# Patient Record
Sex: Male | Born: 1935 | Hispanic: No | Marital: Single
Health system: Southern US, Community
[De-identification: ages and names within clinical notes are randomized; demographics above are authoritative.]

## PROBLEM LIST (undated history)

## (undated) DIAGNOSIS — F0280 Dementia in other diseases classified elsewhere without behavioral disturbance: Secondary | ICD-10-CM

## (undated) DIAGNOSIS — G309 Alzheimer's disease, unspecified: Secondary | ICD-10-CM

## (undated) DIAGNOSIS — E119 Type 2 diabetes mellitus without complications: Secondary | ICD-10-CM

## (undated) DIAGNOSIS — E559 Vitamin D deficiency, unspecified: Secondary | ICD-10-CM

## (undated) DIAGNOSIS — I10 Essential (primary) hypertension: Secondary | ICD-10-CM

## (undated) DIAGNOSIS — M1990 Unspecified osteoarthritis, unspecified site: Secondary | ICD-10-CM

## (undated) DIAGNOSIS — E785 Hyperlipidemia, unspecified: Secondary | ICD-10-CM

---

## 1998-03-21 ENCOUNTER — Encounter: Payer: Self-pay | Admitting: Emergency Medicine

## 1998-03-21 ENCOUNTER — Emergency Department (HOSPITAL_COMMUNITY): Admission: EM | Admit: 1998-03-21 | Discharge: 1998-03-21 | Payer: Self-pay | Admitting: *Deleted

## 2017-06-03 ENCOUNTER — Emergency Department (HOSPITAL_COMMUNITY): Payer: Medicare (Managed Care)

## 2017-06-03 ENCOUNTER — Encounter (HOSPITAL_COMMUNITY): Payer: Self-pay

## 2017-06-03 ENCOUNTER — Inpatient Hospital Stay (HOSPITAL_COMMUNITY): Payer: Medicare (Managed Care)

## 2017-06-03 ENCOUNTER — Encounter (HOSPITAL_COMMUNITY)
Admission: EM | Disposition: E | Payer: Self-pay | Source: Home / Self Care | Attending: Student in an Organized Health Care Education/Training Program

## 2017-06-03 ENCOUNTER — Emergency Department (HOSPITAL_COMMUNITY): Payer: Medicare (Managed Care) | Admitting: Certified Registered Nurse Anesthetist

## 2017-06-03 ENCOUNTER — Inpatient Hospital Stay (HOSPITAL_COMMUNITY)
Admission: EM | Admit: 2017-06-03 | Discharge: 2017-06-10 | DRG: 023 | Disposition: E | Payer: Medicare (Managed Care) | Attending: Neurology | Admitting: Neurology

## 2017-06-03 ENCOUNTER — Telehealth (HOSPITAL_COMMUNITY): Payer: Self-pay | Admitting: *Deleted

## 2017-06-03 DIAGNOSIS — R29705 NIHSS score 5: Secondary | ICD-10-CM | POA: Diagnosis present

## 2017-06-03 DIAGNOSIS — E785 Hyperlipidemia, unspecified: Secondary | ICD-10-CM | POA: Diagnosis present

## 2017-06-03 DIAGNOSIS — Z9911 Dependence on respirator [ventilator] status: Secondary | ICD-10-CM

## 2017-06-03 DIAGNOSIS — I639 Cerebral infarction, unspecified: Secondary | ICD-10-CM | POA: Diagnosis not present

## 2017-06-03 DIAGNOSIS — R414 Neurologic neglect syndrome: Secondary | ICD-10-CM | POA: Diagnosis present

## 2017-06-03 DIAGNOSIS — I63411 Cerebral infarction due to embolism of right middle cerebral artery: Principal | ICD-10-CM | POA: Diagnosis present

## 2017-06-03 DIAGNOSIS — G9382 Brain death: Secondary | ICD-10-CM | POA: Diagnosis present

## 2017-06-03 DIAGNOSIS — I6601 Occlusion and stenosis of right middle cerebral artery: Secondary | ICD-10-CM | POA: Diagnosis present

## 2017-06-03 DIAGNOSIS — G309 Alzheimer's disease, unspecified: Secondary | ICD-10-CM | POA: Diagnosis present

## 2017-06-03 DIAGNOSIS — N179 Acute kidney failure, unspecified: Secondary | ICD-10-CM | POA: Diagnosis present

## 2017-06-03 DIAGNOSIS — G919 Hydrocephalus, unspecified: Secondary | ICD-10-CM | POA: Diagnosis present

## 2017-06-03 DIAGNOSIS — E1165 Type 2 diabetes mellitus with hyperglycemia: Secondary | ICD-10-CM | POA: Diagnosis present

## 2017-06-03 DIAGNOSIS — R4182 Altered mental status, unspecified: Secondary | ICD-10-CM | POA: Diagnosis present

## 2017-06-03 DIAGNOSIS — I609 Nontraumatic subarachnoid hemorrhage, unspecified: Secondary | ICD-10-CM | POA: Diagnosis present

## 2017-06-03 DIAGNOSIS — G8194 Hemiplegia, unspecified affecting left nondominant side: Secondary | ICD-10-CM | POA: Diagnosis present

## 2017-06-03 DIAGNOSIS — G935 Compression of brain: Secondary | ICD-10-CM | POA: Diagnosis present

## 2017-06-03 DIAGNOSIS — H53462 Homonymous bilateral field defects, left side: Secondary | ICD-10-CM | POA: Diagnosis present

## 2017-06-03 DIAGNOSIS — J9601 Acute respiratory failure with hypoxia: Secondary | ICD-10-CM | POA: Diagnosis not present

## 2017-06-03 DIAGNOSIS — F0280 Dementia in other diseases classified elsewhere without behavioral disturbance: Secondary | ICD-10-CM | POA: Diagnosis present

## 2017-06-03 DIAGNOSIS — I10 Essential (primary) hypertension: Secondary | ICD-10-CM | POA: Diagnosis present

## 2017-06-03 DIAGNOSIS — R29810 Facial weakness: Secondary | ICD-10-CM | POA: Diagnosis present

## 2017-06-03 DIAGNOSIS — I351 Nonrheumatic aortic (valve) insufficiency: Secondary | ICD-10-CM | POA: Diagnosis not present

## 2017-06-03 HISTORY — DX: Dementia in other diseases classified elsewhere, unspecified severity, without behavioral disturbance, psychotic disturbance, mood disturbance, and anxiety: F02.80

## 2017-06-03 HISTORY — DX: Alzheimer's disease, unspecified: G30.9

## 2017-06-03 HISTORY — PX: IR PERCUTANEOUS ART THROMBECTOMY/INFUSION INTRACRANIAL INC DIAG ANGIO: IMG6087

## 2017-06-03 HISTORY — DX: Essential (primary) hypertension: I10

## 2017-06-03 HISTORY — PX: IR CT HEAD LTD: IMG2386

## 2017-06-03 HISTORY — DX: Hyperlipidemia, unspecified: E78.5

## 2017-06-03 HISTORY — PX: IR ANGIO INTRA EXTRACRAN SEL COM CAROTID INNOMINATE UNI L MOD SED: IMG5358

## 2017-06-03 HISTORY — DX: Unspecified osteoarthritis, unspecified site: M19.90

## 2017-06-03 HISTORY — DX: Type 2 diabetes mellitus without complications: E11.9

## 2017-06-03 HISTORY — PX: RADIOLOGY WITH ANESTHESIA: SHX6223

## 2017-06-03 HISTORY — DX: Vitamin D deficiency, unspecified: E55.9

## 2017-06-03 LAB — MRSA PCR SCREENING: MRSA by PCR: NEGATIVE

## 2017-06-03 LAB — TYPE AND SCREEN
ABO/RH(D): A POS
ANTIBODY SCREEN: NEGATIVE
UNIT DIVISION: 0
Unit division: 0

## 2017-06-03 LAB — DIFFERENTIAL
BASOS ABS: 0 10*3/uL (ref 0.0–0.1)
Basophils Relative: 0 %
Eosinophils Absolute: 0.8 10*3/uL — ABNORMAL HIGH (ref 0.0–0.7)
Eosinophils Relative: 11 %
LYMPHS ABS: 2.1 10*3/uL (ref 0.7–4.0)
LYMPHS PCT: 29 %
MONOS PCT: 7 %
Monocytes Absolute: 0.5 10*3/uL (ref 0.1–1.0)
NEUTROS ABS: 3.9 10*3/uL (ref 1.7–7.7)
NEUTROS PCT: 53 %

## 2017-06-03 LAB — GLUCOSE, CAPILLARY: Glucose-Capillary: 261 mg/dL — ABNORMAL HIGH (ref 65–99)

## 2017-06-03 LAB — BPAM RBC
Blood Product Expiration Date: 201903072359
Blood Product Expiration Date: 201903112359
ISSUE DATE / TIME: 201902221748
ISSUE DATE / TIME: 201902221748
UNIT TYPE AND RH: 9500
Unit Type and Rh: 9500

## 2017-06-03 LAB — POCT I-STAT 3, ART BLOOD GAS (G3+)
Acid-base deficit: 9 mmol/L — ABNORMAL HIGH (ref 0.0–2.0)
Acid-base deficit: 6 mmol/L — ABNORMAL HIGH (ref 0.0–2.0)
Bicarbonate: 16 mmol/L — ABNORMAL LOW (ref 20.0–28.0)
Bicarbonate: 21.8 mmol/L (ref 20.0–28.0)
O2 Saturation: 99 %
O2 Saturation: 100 %
PCO2 ART: 30.2 mmHg — AB (ref 32.0–48.0)
TCO2: 17 mmol/L — AB (ref 22–32)
TCO2: 23 mmol/L (ref 22–32)
pCO2 arterial: 47.8 mmHg (ref 32.0–48.0)
pH, Arterial: 7.321 — ABNORMAL LOW (ref 7.350–7.450)
pH, Arterial: 7.265 — ABNORMAL LOW (ref 7.350–7.450)
pO2, Arterial: 159 mmHg — ABNORMAL HIGH (ref 83.0–108.0)
pO2, Arterial: 221 mmHg — ABNORMAL HIGH (ref 83.0–108.0)

## 2017-06-03 LAB — COMPREHENSIVE METABOLIC PANEL
ALT: 17 U/L (ref 17–63)
AST: 21 U/L (ref 15–41)
Albumin: 3.6 g/dL (ref 3.5–5.0)
Alkaline Phosphatase: 40 U/L (ref 38–126)
Anion gap: 10 (ref 5–15)
BILIRUBIN TOTAL: 1.3 mg/dL — AB (ref 0.3–1.2)
BUN: 13 mg/dL (ref 6–20)
CO2: 26 mmol/L (ref 22–32)
CREATININE: 1.63 mg/dL — AB (ref 0.61–1.24)
Calcium: 9.5 mg/dL (ref 8.9–10.3)
Chloride: 100 mmol/L — ABNORMAL LOW (ref 101–111)
GFR calc Af Amer: 44 mL/min — ABNORMAL LOW (ref >60)
GFR, EST NON AFRICAN AMERICAN: 38 mL/min — AB (ref >60)
GLUCOSE: 159 mg/dL — AB (ref 65–99)
POTASSIUM: 4.7 mmol/L (ref 3.5–5.1)
Sodium: 136 mmol/L (ref 135–145)
TOTAL PROTEIN: 7.4 g/dL (ref 6.5–8.1)

## 2017-06-03 LAB — CBC
HEMATOCRIT: 49 % (ref 39.0–52.0)
HEMOGLOBIN: 16.4 g/dL (ref 13.0–17.0)
MCH: 28.7 pg (ref 26.0–34.0)
MCHC: 33.5 g/dL (ref 30.0–36.0)
MCV: 85.8 fL (ref 78.0–100.0)
Platelets: 151 10*3/uL (ref 150–400)
RBC: 5.71 MIL/uL (ref 4.22–5.81)
RDW: 13.6 % (ref 11.5–15.5)
WBC: 7.4 10*3/uL (ref 4.0–10.5)

## 2017-06-03 LAB — ETHANOL: Alcohol, Ethyl (B): <10 mg/dL (ref <10)

## 2017-06-03 LAB — I-STAT CHEM 8, ED
BUN: 17 mg/dL (ref 6–20)
CREATININE: 1.6 mg/dL — AB (ref 0.61–1.24)
Calcium, Ion: 1.13 mmol/L — ABNORMAL LOW (ref 1.15–1.40)
Chloride: 100 mmol/L — ABNORMAL LOW (ref 101–111)
GLUCOSE: 154 mg/dL — AB (ref 65–99)
HCT: 52 % (ref 39.0–52.0)
HEMOGLOBIN: 17.7 g/dL — AB (ref 13.0–17.0)
POTASSIUM: 4.7 mmol/L (ref 3.5–5.1)
Sodium: 137 mmol/L (ref 135–145)
TCO2: 28 mmol/L (ref 22–32)

## 2017-06-03 LAB — PROTIME-INR
INR: 1.06
Prothrombin Time: 13.7 seconds (ref 11.4–15.2)

## 2017-06-03 LAB — I-STAT TROPONIN, ED: TROPONIN I, POC: 0 ng/mL (ref 0.00–0.08)

## 2017-06-03 LAB — ABO/RH: ABO/RH(D): A POS

## 2017-06-03 LAB — APTT: APTT: 37 s — AB (ref 24–36)

## 2017-06-03 SURGERY — IR WITH ANESTHESIA
Anesthesia: General

## 2017-06-03 MED ORDER — DEXAMETHASONE SODIUM PHOSPHATE 4 MG/ML IJ SOLN
INTRAMUSCULAR | Status: DC | PRN
  Administered 2017-06-03: 4 mg via INTRAVENOUS

## 2017-06-03 MED ORDER — ACETAMINOPHEN 160 MG/5ML PO SOLN: 650.0000 mg | ORAL | Status: DC | PRN

## 2017-06-03 MED ORDER — NICARDIPINE HCL IN NACL 20-0.86 MG/200ML-% IV SOLN
INTRAVENOUS | Status: DC | PRN
  Administered 2017-06-03: 5 mg/h via INTRAVENOUS

## 2017-06-03 MED ORDER — ACETAMINOPHEN 325 MG PO TABS: 650.0000 mg | ORAL_TABLET | ORAL | Status: DC | PRN

## 2017-06-03 MED ORDER — SODIUM CHLORIDE 0.9 % IV SOLN: Freq: Once | INTRAVENOUS | Status: DC

## 2017-06-03 MED ORDER — INSULIN ASPART 100 UNIT/ML ~~LOC~~ SOLN: 2.0000 [IU] | SUBCUTANEOUS | Status: DC

## 2017-06-03 MED ORDER — SODIUM CHLORIDE 0.9 % IV SOLN
INTRAVENOUS | Status: DC
  Administered 2017-06-03 – 2017-06-04 (×4): via INTRAVENOUS

## 2017-06-03 MED ORDER — NICARDIPINE HCL IN NACL 20-0.86 MG/200ML-% IV SOLN: 0.0000 mg/h | INTRAVENOUS | Status: DC

## 2017-06-03 MED ORDER — FENTANYL CITRATE (PF) 100 MCG/2ML IJ SOLN
INTRAMUSCULAR | Status: AC
  Filled 2017-06-03: qty 2

## 2017-06-03 MED ORDER — STROKE: EARLY STAGES OF RECOVERY BOOK
Freq: Once | Status: DC
  Filled 2017-06-03: qty 1

## 2017-06-03 MED ORDER — CEFAZOLIN SODIUM-DEXTROSE 2-4 GM/100ML-% IV SOLN
INTRAVENOUS | Status: AC
  Filled 2017-06-03: qty 100

## 2017-06-03 MED ORDER — FENTANYL CITRATE (PF) 100 MCG/2ML IJ SOLN: 50.0000 ug | INTRAMUSCULAR | Status: DC | PRN

## 2017-06-03 MED ORDER — LACTATED RINGERS IV SOLN: INTRAVENOUS | Status: DC | PRN

## 2017-06-03 MED ORDER — ACETAMINOPHEN 650 MG RE SUPP: 650.0000 mg | RECTAL | Status: DC | PRN

## 2017-06-03 MED ORDER — IOPAMIDOL (ISOVUE-300) INJECTION 61%
INTRAVENOUS | Status: AC
  Administered 2017-06-03: 48 mL
  Filled 2017-06-03: qty 150

## 2017-06-03 MED ORDER — NITROGLYCERIN 1 MG/10 ML FOR IR/CATH LAB
INTRA_ARTERIAL | Status: AC
  Filled 2017-06-03: qty 10

## 2017-06-03 MED ORDER — TICAGRELOR 90 MG PO TABS
ORAL_TABLET | ORAL | Status: AC
  Filled 2017-06-03: qty 2

## 2017-06-03 MED ORDER — ROCURONIUM BROMIDE 100 MG/10ML IV SOLN
INTRAVENOUS | Status: DC | PRN
  Administered 2017-06-03: 30 mg via INTRAVENOUS
  Administered 2017-06-03 (×2): 20 mg via INTRAVENOUS

## 2017-06-03 MED ORDER — ORAL CARE MOUTH RINSE
15.0000 mL | Freq: Four times a day (QID) | OROMUCOSAL | Status: DC
  Administered 2017-06-03 – 2017-06-04 (×2): 15 mL via OROMUCOSAL

## 2017-06-03 MED ORDER — SODIUM CHLORIDE 0.9 % IV BOLUS (SEPSIS)
1000.0000 mL | Freq: Once | INTRAVENOUS | Status: AC
  Administered 2017-06-03: 1000 mL via INTRAVENOUS

## 2017-06-03 MED ORDER — PROPOFOL 10 MG/ML IV BOLUS
INTRAVENOUS | Status: DC | PRN
  Administered 2017-06-03: 120 mg via INTRAVENOUS

## 2017-06-03 MED ORDER — PHENYLEPHRINE HCL 10 MG/ML IJ SOLN
INTRAMUSCULAR | Status: DC | PRN
  Administered 2017-06-03: 80 ug via INTRAVENOUS
  Administered 2017-06-03: 120 ug via INTRAVENOUS
  Administered 2017-06-03: 40 ug via INTRAVENOUS

## 2017-06-03 MED ORDER — CEFAZOLIN SODIUM-DEXTROSE 2-3 GM-%(50ML) IV SOLR
2.0000 g | Freq: Once | INTRAVENOUS | Status: AC
  Administered 2017-06-03: 2 g via INTRAVENOUS

## 2017-06-03 MED ORDER — EPHEDRINE SULFATE 50 MG/ML IJ SOLN
INTRAMUSCULAR | Status: DC | PRN
  Administered 2017-06-03: 5 mg via INTRAVENOUS

## 2017-06-03 MED ORDER — LIDOCAINE HCL (CARDIAC) 20 MG/ML IV SOLN
INTRAVENOUS | Status: DC | PRN
  Administered 2017-06-03: 60 mg via INTRAVENOUS

## 2017-06-03 MED ORDER — PHENYLEPHRINE HCL 10 MG/ML IJ SOLN
INTRAVENOUS | Status: DC | PRN
  Administered 2017-06-03: 20 ug/min via INTRAVENOUS

## 2017-06-03 MED ORDER — INSULIN ASPART 100 UNIT/ML ~~LOC~~ SOLN
0.0000 [IU] | SUBCUTANEOUS | Status: DC
  Administered 2017-06-04: 8 [IU] via SUBCUTANEOUS
  Administered 2017-06-04 (×2): 3 [IU] via SUBCUTANEOUS

## 2017-06-03 MED ORDER — SODIUM CHLORIDE 0.9 % IV SOLN
0.0000 ug/min | INTRAVENOUS | Status: DC
  Administered 2017-06-04: 10 ug/min via INTRAVENOUS
  Filled 2017-06-03: qty 1

## 2017-06-03 MED ORDER — CHLORHEXIDINE GLUCONATE 0.12% ORAL RINSE (MEDLINE KIT)
15.0000 mL | Freq: Two times a day (BID) | OROMUCOSAL | Status: DC
  Administered 2017-06-03 – 2017-06-04 (×2): 15 mL via OROMUCOSAL

## 2017-06-03 MED ORDER — IOPAMIDOL (ISOVUE-300) INJECTION 61%
INTRAVENOUS | Status: AC
  Administered 2017-06-03: 60 mL
  Filled 2017-06-03: qty 150

## 2017-06-03 MED ORDER — HEPARIN SODIUM (PORCINE) 5000 UNIT/ML IJ SOLN
5000.0000 [IU] | Freq: Three times a day (TID) | INTRAMUSCULAR | Status: DC
  Administered 2017-06-03 – 2017-06-04 (×2): 5000 [IU] via SUBCUTANEOUS
  Filled 2017-06-03 (×2): qty 1

## 2017-06-03 MED ORDER — EPTIFIBATIDE 20 MG/10ML IV SOLN
INTRAVENOUS | Status: AC
  Filled 2017-06-03: qty 10

## 2017-06-03 MED ORDER — ASPIRIN 81 MG PO CHEW
CHEWABLE_TABLET | ORAL | Status: AC
  Filled 2017-06-03: qty 1

## 2017-06-03 MED ORDER — IOPAMIDOL (ISOVUE-370) INJECTION 76%
INTRAVENOUS | Status: AC
  Administered 2017-06-03: 50 mL
  Filled 2017-06-03: qty 50

## 2017-06-03 MED ORDER — ASPIRIN EC 81 MG PO TBEC
DELAYED_RELEASE_TABLET | ORAL | Status: AC | PRN
  Administered 2017-06-03: 81 mg via ORAL

## 2017-06-03 MED ORDER — SUCCINYLCHOLINE CHLORIDE 200 MG/10ML IV SOSY
PREFILLED_SYRINGE | INTRAVENOUS | Status: DC | PRN
  Administered 2017-06-03: 120 mg via INTRAVENOUS

## 2017-06-03 MED ORDER — IOPAMIDOL (ISOVUE-370) INJECTION 76%
INTRAVENOUS | Status: AC
  Administered 2017-06-03: 40 mL
  Filled 2017-06-03: qty 50

## 2017-06-03 MED ORDER — FENTANYL CITRATE (PF) 100 MCG/2ML IJ SOLN
INTRAMUSCULAR | Status: DC | PRN
  Administered 2017-06-03 (×2): 50 ug via INTRAVENOUS

## 2017-06-03 MED ORDER — PROPOFOL 1000 MG/100ML IV EMUL: 0.0000 ug/kg/min | INTRAVENOUS | Status: DC

## 2017-06-03 MED ORDER — LABETALOL HCL 5 MG/ML IV SOLN
INTRAVENOUS | Status: DC | PRN
  Administered 2017-06-03: 10 mg via INTRAVENOUS

## 2017-06-03 MED ORDER — TICAGRELOR 60 MG PO TABS
ORAL_TABLET | ORAL | Status: AC | PRN
  Administered 2017-06-03: 180 mg

## 2017-06-03 MED ORDER — DOCUSATE SODIUM 50 MG/5ML PO LIQD
100.0000 mg | Freq: Two times a day (BID) | ORAL | Status: DC
  Administered 2017-06-03: 100 mg
  Filled 2017-06-03: qty 10

## 2017-06-03 MED ORDER — SENNOSIDES-DOCUSATE SODIUM 8.6-50 MG PO TABS: 1.0000 | ORAL_TABLET | Freq: Every evening | ORAL | Status: DC | PRN

## 2017-06-03 NOTE — ED Notes (Signed)
Pt in ir  I was told the pt would leave there and go to 4 n

## 2017-06-03 NOTE — Progress Notes (Signed)
Apnea test was performed for 8 minutes using 8L of oxygen via ETT.  No respirations were noted, patient apneic for 8 minutes.  SATS remained 98% throughout and no change in HR and BP during procedure. ABG drawn results given to MD Hammonds. Patient placed back on vent on previous settings.

## 2017-06-03 NOTE — Progress Notes (Signed)
PCCM Interval Note    Dr. Wilford CornerArora spoke with family (Niece from [place]) via phone with friends are at bedside. Understand severity of condition, after conversation code status updated to Limited Code - okay with pressors/continued ventilation, no CPR/ACLS medications.    Jovita KussmaulKatalina Eubanks, AGACNP-BC [facility]  Pgr: [phone]  PCCM Pgr: [phone]

## 2017-06-03 NOTE — Anesthesia Preprocedure Evaluation (Addendum)
Anesthesia Evaluation  Patient identified by MRN, date of birth, ID band Patient confused    Reviewed: Allergy & Precautions, Patient's Chart, lab work & pertinent test resultsPreop documentation limited or incomplete due to emergent nature of procedure.  Airway   TM Distance: >3 FB     Dental  (+) Loose, Missing, Poor Dentition   Pulmonary neg pulmonary ROS,    breath sounds clear to auscultation       Cardiovascular hypertension,  Rhythm:Regular Rate:Normal     Neuro/Psych Dementia CVA    GI/Hepatic negative GI ROS, Neg liver ROS,   Endo/Other  diabetes  Renal/GU negative Renal ROS  negative genitourinary   Musculoskeletal  (+) Arthritis ,   Abdominal   Peds  Hematology negative hematology ROS (+)   Anesthesia Other Findings   Reproductive/Obstetrics                             Anesthesia Physical Anesthesia Plan  ASA: III and emergent  Anesthesia Plan: General   Post-op Pain Management:    Induction: Intravenous and Rapid sequence  PONV Risk Score and Plan: 2 and Treatment may vary due to age or medical condition and Ondansetron  Airway Management Planned: Oral ETT  Additional Equipment: None  Intra-op Plan:   Post-operative Plan: Possible Post-op intubation/ventilation  Informed Consent:   Plan Discussed with: CRNA  Anesthesia Plan Comments:         Anesthesia Quick Evaluation

## 2017-06-03 NOTE — Code Documentation (Signed)
83[facility].  Patient was LKW yesterday, however, time is unclear. Per family that patient lives with he was experiencing abnormal behaviors last night and this morning prior to going to an adult daycare center.  Patient sent to the ED d/t left sided weakness noted by staff at center.  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  Stroke team to the bedside.  Patient to CT for CTP then to STAT MRI.  Patient monitored during imaging and VSS. NIHSS 7, see documentation for details and code stroke times.  Patient with left facial droop, left arm and leg drift, left hemianopia and neglect on exam.  Patient to IR per neuro NP. Handoff with IR RN Whitney.

## 2017-06-03 NOTE — Progress Notes (Signed)
Patient ID: [name], male   DOB: 17-Oct-1934, 83 y.o.   MRN: [id number] INR . 83 yr old M with mRS  Of 2. LSW? Sometime today noted to have Lt sided weakness.and altered behavior CT brain No ICH. 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 CTP  CBF < 30 % vol 0 ml. Tmax > 6.0 ml vol 106ml. Mismatch vol 106 ml. Mismatch ratio infinite. Case reviewed with neurology. Imaging evaluated. Option of endovascular revascularization of occluded inf division with extensive penumbra and MRI findings of punctate diffusion in the same tteritory felt to be appropriate.Emergency consent was obtained from 2  MDs familiar with the case. No family members or close relatives were reachable despite multiple attempts..  Under GA diagnostic arteriogram of RT CCA revealed a large fast flow RT ECA /transverse sinus and sigmoid sinus DAVF shunting blood extracranially. Very attenuated opacification noted of the RT MCA distribution. Uncomplicated access obtained into sup branch of MCA intracranially  with microcatheter.safe position  confirmed Control; arteriogram from  A RT ICA injection revealed transient extravasation of contrast anteriorly and laterally. Partial recanalization of the inf division was noted with  extravasation at distal end of the recanalized inf division.. Hemodynamic chages managed appropriately with anesthesia and neurologist. x1 unit of platelets transfused as patient had been given 180 mg of brilinta and 81 mg of aspirin  Po in anticipation of possible stenting of the occluded vessel. Also prox ICA flow arrest was established as soon as extravasation was noted.. Flow arrest was reversed after complete unit of platelets was transfused. Control arteriograms performed in the rt and lt CCAs demonstrated absent flow intracranially. X2 DNYA CTs were also performed which showed diffuse SAH/and contrast  In the anterior and post compartments with the later CT  suggesting infratentorial l  herniation.. D/W neurologist. Fatima SangerS Harini Dearmond MD

## 2017-06-03 NOTE — ED Notes (Signed)
[facility] called/Activate Code Stroke @ 1446-per Dr. Lamar SprinklesButler-called by Marylene LandAngela

## 2017-06-03 NOTE — H&P (Addendum)
Admission H&P    Chief Complaint: none HPI: Gurjit Mcartor is an 83 y.o. male who has dementia, DM2, HTN, HLD. He was sent to the ER via EMS by his care team at his day care center for left side weakness. Day center reports that he arrived disheveled, putting a shirt on like underwear and acting odd. The left side weakness completely resolve by the time the EMS team arrived, thus no code stroke called. He is not able to provide much information, thus it is from his care-givers and staff. Normally he can speak good [place], but today he was speaking only [place]. Interpretor is at bedside. As part of his work up in the ER he had a CT/CTA. This showed a Rt M 2-3 acute occlusion, with no early ischemic changes seen. This was emergently called to the ER at 1358. CODE STROKE was then activated. Upon emergent neuro exam, he had left side neglect, left hemianopia, slight nasolabial fold flattening, NIH 5. Later there seemed to be intermittent drift in left arm/leg. NIH 5-7.  Apparently he lives with a family that helps care for them, but they are not related to him, he has no immediate family. We had considerable delay (~2mn)  in treatment due to trying to figure out who to speak with to gain an understanding of time lst normal, his baseline function and consent. Despite his poor memory, he is normally very functional at baseline, dresses himself and catches a bus to his care center daily. The family in which he lives with says he started acting oddly last night, but no left side weakness reported.  Unknown time last "normal" thus no IV tPA given. We preceded to CTP and stat MRI to better evaluate for early/late ischemic changes. There was a LVO seen on CTA at the Rt M2-3 segment, however there was a short segment in M1 that also appeared to have clot burden, this was discussed with Dr DEstanislado Pandydirectly since with an infinite mismatch seen on perfusion ratio, emergent  IA Thrombectomy was indicated.    LSN:  unknown tPA Given: not given  Past Medical History Past Medical History:  Diagnosis Date  . Alzheimer's dementia without behavioral disturbance   . Arthritis   . Diabetes mellitus without complication ([place]   . Hyperlipidemia   . Hypertension   . Vitamin D deficiency     Past Surgical History History reviewed. No pertinent surgical history.  Family History History reviewed. No pertinent family history.  Social History  has no tobacco, alcohol, and drug history on file.  Allergies No Known Allergies  Home Medications  (Not in a hospital admission)  Hospital Medications . fentaNYL      . iopamidol        ROS:  Unable to obtain d/t pt condition   Physical Examination:  Vitals:   05/21/2017 1345 05/23/2017 1400 06/09/2017 1430 05/17/2017 1500  BP:  (!) 156/99 (!) 165/84 (!) 141/80  Pulse: 65 (!) 56 63 (!) 57  Resp: 18 16 17  (!) 21  Temp:      TempSrc:      SpO2: 99% 96% 95% 97%    General - confused, frail, elderly Heart - Regular rate and rhythm - no murmer Lungs - Clear to auscultation Abdomen - Soft - non tender Extremities - Distal pulses intact - no edema Skin - Warm and dry   Neurologic Examination:   Mental Status:  Alert, oriented to his baseline dementia. Speech without evidence of dysarthria or aphasia.  Able to follow 3 step commands without difficulty. There is considerable dense left side neglect, doesn't follow commands on left side. Cranial Nerves:  III/IV/VI-Pupils were equal and reacted.Left hemianopia V/VII-no facial numbness; mild left facial weakness. Flattened nasolabial fold VIII-hearing normal.  X-normal speech and symmetrical palatal movement.  XII-midline tongue extension  Motor: Right : Upper extremity   5/5    Left:     Upper extremity   5/5  Lower extremity   5/5     Lower extremity   5/5 Tone and bulk:normal tone throughout; no atrophy noted Sensory: Intact to light touch in all extremities. extinct to DBS Plantars: Downgoing  bilaterally  Cerebellar: Normal FNF and heel to shin bilaterally. Gait: not tested  NIH: see HPI for specific scoring   LABORATORY STUDIES   Basic Metabolic Panel: Recent Labs  Lab 05/25/2017 1218 05/22/2017 1234  NA 136 137  K 4.7 4.7  CL 100* 100*  CO2 26  --   GLUCOSE 159* 154*  BUN 13 17  CREATININE 1.63* 1.60*  CALCIUM 9.5  --     Liver Function Tests: Recent Labs  Lab 05/27/2017 1218  AST 21  ALT 17  ALKPHOS 40  BILITOT 1.3*  PROT 7.4  ALBUMIN 3.6   No results for input(s): LIPASE, AMYLASE in the last 168 hours. No results for input(s): AMMONIA in the last 168 hours.  CBC: Recent Labs  Lab 05/15/2017 1218 05/27/2017 1234  WBC 7.4  --   NEUTROABS 3.9  --   HGB 16.4 17.7*  HCT 49.0 52.0  MCV 85.8  --   PLT 151  --     Cardiac Enzymes: No results for input(s): CKTOTAL, CKMB, CKMBINDEX, TROPONINI in the last 168 hours.  BNP: Invalid input(s): POCBNP  CBG: No results for input(s): GLUCAP in the last 168 hours.  Microbiology:   Coagulation Studies: Recent Labs    06/09/2017 1218  LABPROT 13.7  INR 1.06    Urinalysis: No results for input(s): COLORURINE, LABSPEC, PHURINE, GLUCOSEU, HGBUR, BILIRUBINUR, KETONESUR, PROTEINUR, UROBILINOGEN, NITRITE, LEUKOCYTESUR in the last 168 hours.  Invalid input(s): APPERANCEUR  Lipid Panel:  No results found for: CHOL, TRIG, HDL, CHOLHDL, VLDL, LDLCALC  HgbA1C:  No results found for: HGBA1C  Urine Drug Screen:  No results found for: LABOPIA, COCAINSCRNUR, LABBENZ, AMPHETMU, THCU, LABBARB   Alcohol Level:  Recent Labs  Lab 05/14/2017 1217  ETH <10    Miscellaneous Labs:  EKG:  EKG      IMAGING Ct Angio Head W Or Wo Contrast  Result Date: 06/02/2017 CLINICAL DATA:  Left-sided weakness while at physical therapy today. EXAM: CT HEAD WITHOUT CONTRAST CT ANGIOGRAPHY OF THE HEAD AND NECK TECHNIQUE: Contiguous axial images were obtained from the base of the skull through the vertex without intravenous  contrast. Multidetector CT imaging of the head and neck was performed using the standard protocol during bolus administration of intravenous contrast. Multiplanar CT image reconstructions and MIPs were obtained to evaluate the vascular anatomy. Carotid stenosis measurements (when applicable) are obtained utilizing NASCET criteria, using the distal internal carotid diameter as the denominator. CONTRAST:  56m ISOVUE-370 IOPAMIDOL (ISOVUE-370) INJECTION 76% COMPARISON:  None. FINDINGS: CT HEAD Brain: Generalized atrophy. Chronic small-vessel ischemic changes of the cerebral hemispheric white matter. No sign of acute infarction, mass lesion, hemorrhage, hydrocephalus or extra-axial collection. Vascular: There is atherosclerotic calcification of the major vessels at the base of the brain. Skull: Normal Sinuses/Orbits: Negative CTA NECK Aortic arch: Aortic atherosclerosis.  No aneurysm or  dissection. Right carotid system: Common carotid artery widely patent to the bifurcation. Carotid bifurcation is normal without soft or calcified plaque. Cervical internal carotid artery is tortuous but widely patent. Left carotid system: Common carotid artery widely patent to the bifurcation. Carotid bifurcation is normal without soft or calcified plaque. Cervical ICA is tortuous but otherwise normal. Vertebral arteries:Right vertebral artery is dominant. The origin is widely patent. The vessel is widely patent through the cervical region. Left vertebral artery is occluded at its origin but shows some reconstitution by cervical collaterals. Skeleton: Ordinary mid cervical spondylosis. Other neck: No mass or lymphadenopathy. Upper lobes are clear. There is either stenosis or partial thrombosis the left subclavian/innominate vein. CTA HEAD Anterior circulation: Both internal carotid arteries are patent through the skull base and siphon regions. There is ordinary peripheral atherosclerotic calcification in the carotid siphon regions. No  stenosis greater than 30% is suspected. The anterior and middle cerebral vessels are approximately patent without proximal stenosis, aneurysm or vascular malformation. There are missing/hypoperfusion in M2 and M3 regions on the right suggesting embolic disease in the right MCA territory. Posterior circulation: Right vertebral artery is dominant, supplying the basilar. Left vertebral artery is a small vessel that terminates in PICA. Basilar artery shows multiple serial stenoses. Flow is present within the superior cerebellar and left posterior cerebral artery from the posterior circulation. Right PCA receives it supply from the anterior circulation. Venous sinuses: Patent and normal. Anatomic variants: None significant. Delayed phase: No abnormal enhancement. IMPRESSION: Head CT does not show any acute ischemic change at this moment. However, there appears to be embolic disease to the right MCA territory in the M2 to M3 vessels. Consider CT perfusion study. Left vertebral artery occluded proximally with reconstitution of a small vessel in the upper cervical region. Serial basilar artery stenoses. These results were called by telephone at the time of interpretation on 05/26/2017 at 2:27 pm to Dr. Aletta Edouard , who verbally acknowledged these results. Electronically Signed   By: Nelson Chimes M.D.   On: 05/20/2017 14:30   Ct Head Wo Contrast  Result Date: 05/21/2017 CLINICAL DATA:  Left-sided weakness while at physical therapy today. EXAM: CT HEAD WITHOUT CONTRAST CT ANGIOGRAPHY OF THE HEAD AND NECK TECHNIQUE: Contiguous axial images were obtained from the base of the skull through the vertex without intravenous contrast. Multidetector CT imaging of the head and neck was performed using the standard protocol during bolus administration of intravenous contrast. Multiplanar CT image reconstructions and MIPs were obtained to evaluate the vascular anatomy. Carotid stenosis measurements (when applicable) are obtained  utilizing NASCET criteria, using the distal internal carotid diameter as the denominator. CONTRAST:  29m ISOVUE-370 IOPAMIDOL (ISOVUE-370) INJECTION 76% COMPARISON:  None. FINDINGS: CT HEAD Brain: Generalized atrophy. Chronic small-vessel ischemic changes of the cerebral hemispheric white matter. No sign of acute infarction, mass lesion, hemorrhage, hydrocephalus or extra-axial collection. Vascular: There is atherosclerotic calcification of the major vessels at the base of the brain. Skull: Normal Sinuses/Orbits: Negative CTA NECK Aortic arch: Aortic atherosclerosis.  No aneurysm or dissection. Right carotid system: Common carotid artery widely patent to the bifurcation. Carotid bifurcation is normal without soft or calcified plaque. Cervical internal carotid artery is tortuous but widely patent. Left carotid system: Common carotid artery widely patent to the bifurcation. Carotid bifurcation is normal without soft or calcified plaque. Cervical ICA is tortuous but otherwise normal. Vertebral arteries:Right vertebral artery is dominant. The origin is widely patent. The vessel is widely patent through the cervical region. Left vertebral  artery is occluded at its origin but shows some reconstitution by cervical collaterals. Skeleton: Ordinary mid cervical spondylosis. Other neck: No mass or lymphadenopathy. Upper lobes are clear. There is either stenosis or partial thrombosis the left subclavian/innominate vein. CTA HEAD Anterior circulation: Both internal carotid arteries are patent through the skull base and siphon regions. There is ordinary peripheral atherosclerotic calcification in the carotid siphon regions. No stenosis greater than 30% is suspected. The anterior and middle cerebral vessels are approximately patent without proximal stenosis, aneurysm or vascular malformation. There are missing/hypoperfusion in M2 and M3 regions on the right suggesting embolic disease in the right MCA territory. Posterior  circulation: Right vertebral artery is dominant, supplying the basilar. Left vertebral artery is a small vessel that terminates in PICA. Basilar artery shows multiple serial stenoses. Flow is present within the superior cerebellar and left posterior cerebral artery from the posterior circulation. Right PCA receives it supply from the anterior circulation. Venous sinuses: Patent and normal. Anatomic variants: None significant. Delayed phase: No abnormal enhancement. IMPRESSION: Head CT does not show any acute ischemic change at this moment. However, there appears to be embolic disease to the right MCA territory in the M2 to M3 vessels. Consider CT perfusion study. Left vertebral artery occluded proximally with reconstitution of a small vessel in the upper cervical region. Serial basilar artery stenoses. These results were called by telephone at the time of interpretation on 06/02/2017 at 2:27 pm to Dr. Aletta Edouard , who verbally acknowledged these results. Electronically Signed   By: Nelson Chimes M.D.   On: 05/26/2017 14:30   Ct Angio Neck W Or Wo Contrast  Result Date: 05/17/2017 CLINICAL DATA:  Left-sided weakness while at physical therapy today. EXAM: CT HEAD WITHOUT CONTRAST CT ANGIOGRAPHY OF THE HEAD AND NECK TECHNIQUE: Contiguous axial images were obtained from the base of the skull through the vertex without intravenous contrast. Multidetector CT imaging of the head and neck was performed using the standard protocol during bolus administration of intravenous contrast. Multiplanar CT image reconstructions and MIPs were obtained to evaluate the vascular anatomy. Carotid stenosis measurements (when applicable) are obtained utilizing NASCET criteria, using the distal internal carotid diameter as the denominator. CONTRAST:  7m ISOVUE-370 IOPAMIDOL (ISOVUE-370) INJECTION 76% COMPARISON:  None. FINDINGS: CT HEAD Brain: Generalized atrophy. Chronic small-vessel ischemic changes of the cerebral hemispheric  white matter. No sign of acute infarction, mass lesion, hemorrhage, hydrocephalus or extra-axial collection. Vascular: There is atherosclerotic calcification of the major vessels at the base of the brain. Skull: Normal Sinuses/Orbits: Negative CTA NECK Aortic arch: Aortic atherosclerosis.  No aneurysm or dissection. Right carotid system: Common carotid artery widely patent to the bifurcation. Carotid bifurcation is normal without soft or calcified plaque. Cervical internal carotid artery is tortuous but widely patent. Left carotid system: Common carotid artery widely patent to the bifurcation. Carotid bifurcation is normal without soft or calcified plaque. Cervical ICA is tortuous but otherwise normal. Vertebral arteries:Right vertebral artery is dominant. The origin is widely patent. The vessel is widely patent through the cervical region. Left vertebral artery is occluded at its origin but shows some reconstitution by cervical collaterals. Skeleton: Ordinary mid cervical spondylosis. Other neck: No mass or lymphadenopathy. Upper lobes are clear. There is either stenosis or partial thrombosis the left subclavian/innominate vein. CTA HEAD Anterior circulation: Both internal carotid arteries are patent through the skull base and siphon regions. There is ordinary peripheral atherosclerotic calcification in the carotid siphon regions. No stenosis greater than 30% is suspected. The anterior  and middle cerebral vessels are approximately patent without proximal stenosis, aneurysm or vascular malformation. There are missing/hypoperfusion in M2 and M3 regions on the right suggesting embolic disease in the right MCA territory. Posterior circulation: Right vertebral artery is dominant, supplying the basilar. Left vertebral artery is a small vessel that terminates in PICA. Basilar artery shows multiple serial stenoses. Flow is present within the superior cerebellar and left posterior cerebral artery from the posterior  circulation. Right PCA receives it supply from the anterior circulation. Venous sinuses: Patent and normal. Anatomic variants: None significant. Delayed phase: No abnormal enhancement. IMPRESSION: Head CT does not show any acute ischemic change at this moment. However, there appears to be embolic disease to the right MCA territory in the M2 to M3 vessels. Consider CT perfusion study. Left vertebral artery occluded proximally with reconstitution of a small vessel in the upper cervical region. Serial basilar artery stenoses. These results were called by telephone at the time of interpretation on 06/02/2017 at 2:27 pm to Dr. Aletta Edouard , who verbally acknowledged these results. Electronically Signed   By: Nelson Chimes M.D.   On: 06/01/2017 14:30   Mr Brain Wo Contrast  Result Date: 05/14/2017 CLINICAL DATA:  Code stroke follow-up. The patient was sent directly to interventional radiology following examination. EXAM: MRI HEAD WITHOUT CONTRAST TECHNIQUE: Multiplanar, multiecho pulse sequences of the brain and surrounding structures were obtained without intravenous contrast. COMPARISON:  Head CT and CTA/perfusion 06/05/2017 FINDINGS: Axial diffusion weighted imaging and axial T2-weighted FLAIR imaging were performed. There is multifocal abnormal diffusion restriction within the posterior right MCA territory, much less than the volume of penumbra identified on the perfusion study. Multifocal periventricular white matter hyperintensity, most often a result of chronic microvascular ischemia. No acute hemorrhage or mass effect. IMPRESSION: Multiple areas of ischemic infarct within the posterior right MCA territory, but much smaller than the volume of penumbra identified on the perfusion study. Electronically Signed   By: Ulyses Jarred M.D.   On: 05/23/2017 16:08   Ct Cerebral Perfusion W Contrast  Result Date: 06/07/2017 CLINICAL DATA:  83 year old male with right MCA Z6/X0 thromboembolic disease suspected on CTA  head and neck today performed for stroke-like symptoms. CT perfusion requested to evaluate treatment options. EXAM: CT PERFUSION BRAIN TECHNIQUE: Multiphase CT imaging of the brain was performed following IV bolus contrast injection. Subsequent parametric perfusion maps were calculated using RAPID software. CONTRAST:  53m ISOVUE-370 IOPAMIDOL (ISOVUE-370) INJECTION 76% COMPARISON:  CTA head and neck 1358 hr today. FINDINGS: CT Brain Perfusion Findings: CBF (<30%) Volume: None. Perfusion (Tmax>6.0s) volume: 106  mL Mismatch Volume: 106 mL Mismatch ratio: Infant. Infarction Location:No core infarct, but right MCA territory penumbra. Other findings: CT perfusion source images demonstrate the same pattern of abnormal right MCA branches as on the earlier CTA. There seems to be a short right M1 segment, early bifurcation. It is unclear whether or not there is focal thrombus at the right MCA bifurcation. IMPRESSION: 1. No core infarct detected by CT perfusion, which seems valid in light of normal ASPECTS thus far. 2. Right MCA territory penumbra estimated at 106 mL. 3. This study was reviewed in person with Dr. AAmie Portlandon 05/29/2017 at 1540 hrs. Electronically Signed   By: HGenevie AnnM.D.   On: 06/02/2017 15:56    Assessment: 83y.o. male with waxing/waning stroke symptoms. Unknown time last "normal" thus no IV tPA given. There was a LVO in M1, M2-3 seen on CTA with an infinite mismatch seen on perfusio  ratio, emergent  IA Thrombectomy was indicated.   Stroke Risk Factors - age, HTN, HLD, DM2  # Acute Ischemic Stroke, R MCA- further work up for etiology. This could be cardioembolic vs large vessel thrmboemboi.  # HTN- permissive for now, goals pending TICI flow achieved at end of procedure.  # HLD- lipids ordered. Goal <100 # DM2, with hyperglycimia- hold home meds, SSI  # Dementia- despite memory loss, typically he is able to preform all ADLs and only needs min asst with ambulation. mRS 1-2.  Plan:  Admit  to NTICU post procedure, will be greater than 2MNs  HgbA1c, fasting lipid panel  F/u imaging in 24h post procedure  PT consult, OT consult, Speech consult  Echocardiogram  Carotid dopplers  Risk factor modification  Telemetry monitoring  Frequent neuro checks per post thrombectomy protocol  Attending Neurologist's Note to Follow  Attending Neurohospitalist Addendum Patient seen and examined with APP/Resident. Agree with the history and physical as documented above.  Briefly, right MCA stroke due to occlusion or atherosclerosis of the Right MCA, posterior division.  Difficult exam with language barrier, but following simple commands, with mild left hemiparesis and complete visual and sensory neglect of the left side.  NIH stroke scale at least 6 with left-sided neglect, left visual field cut and mild left hemiparesis.  Perfusion showed favorable profile with >100cc area at risk with no core. MRI was done stat that showed scatterd embolic looking strokes in the same area as penumbra, smaller than the penumbra indicating possible salvageable brain tissue.  We attempted for over an hour to reach family members-both the numbers below.  The are not family members and did not agree to be that decision makers.  Given the emergency of the situation, neurologist and interventional radiologist made a 2 physician decision to pursue with emergent diagnostic cerebral angiogram and possible DVT in the patient's best interest.  Patient taken for diagnostic cerebral angio and possible EVT. He was given loading dose of Brilinta in anticipation for a possible stent. 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 Subsequent run showed contrast leakage -indicating possible bleeding away from the site of the guidewire or microcatheter.  Dyna CT on the table showed hemorrhage - anterolateral to the vessel of interest as well as scattered SAH, some of which will be contrast and possibly resolve  with time and not be visible on repeat imaging but some of it is real SAH. Also occlusion of the right transverse sinus noted. Unclear etiology of the bleed.  It is possible that there was a right middle cerebral artery posterior division occlusion which was actually a dissection and with recanalization or partial recanalization dissected into the subarachnoid space and caused a massive subarachnoid hemorrhage.  Dr. Estanislado Pandy ordered 2units of platelets.  SBP initially at some point bumped up to >200 and then with labetalol and cardene, came down rapidly in the 60's. Pressors ordered by anesthesia.  Delay in platelets due to shortage.  1 unit of platelets was sent.  Rt carotid balloon deflation planned for after the platelet infusion due to risk of reperfusion related bleed per Dr. Estanislado Pandy.  In the interim, also noted on the IR table that the patient's left pupil became fixed and dilated. Repeat Dyna CT was done which showed more extensive subarachnoid bleed. 1 unit of platelets was infused. Last run was made prior to retracting the microcatheter and guidewire.  There was no flow noted to the right side of the brain or  the left side of the brain.  Procedure was aborted.  Patient was moved to the neuro ICU.   Family was called-his friends where he was living-phone number [phone], who directed [place] to call in Mr. Ceasar Mons -[phone].  Dr. Estanislado Pandy and I personally spoke with both of these people and explained that it was imperative that the get in touch with a blood relative or a family member or next of kin.  1 of these people is going to come to the hospital in the next hour or so, and we will try to get more information by family.  We have not been able to contact any family members and have only been able to contact the above 2 phone numbers and people have answered those, who are family friends and not next of kin her blood relatives.  Because the patient still under the effect of the  anesthetics, a declaration of brain death at this time cannot be done.  He will be moved to the neuro ICU. We will follow him in the neuro ICU. We will obtain consultation for pulmonary critical care. At this time I do not think that his current clinical picture is compatible with survival.  I would definitely agree with obtaining a limited DNR (okay to use pressors, already intubated)- with 2 physician consent due to the absence of any next of kin and futility of the treatment based on the angiographic findings.  Formal angiographic report pending at this time.  Case discussed personally with Dr. Estanislado Pandy.  Oncoming on-call hospitalist made aware of the current situation.  Neurology team will continue to follow with you.  -- Amie Portland, MD [facility] Neurohospitalist Pager: [phone] If 7pm to 7am, please call on call as listed on AMION.  Delays/issues in the process: --history unclear re: LKN time. --Family member  --Code stroke activation after obtaining CTA --Major delays in trying to locate next of kin for consent. Major language barrier added to this.   CRITICAL CARE ATTESTATION This patient is critically ill and at significant risk of neurological worsening, death and care requires constant monitoring of vital signs, hemodynamics,respiratory and cardiac monitoring. I spent 120  minutes of neurocritical care time performing neurological assessment, discussion with family, other specialists and medical decision making of high complexityin the care of  this patient.

## 2017-06-03 NOTE — Anesthesia Postprocedure Evaluation (Signed)
Anesthesia Post Note  Patient: Adam Hull  Procedure(s) Performed: IR WITH ANESTHESIA (N/A )     Patient location during evaluation: ICU (1O10(4N22) Anesthesia Type: General Post-procedure mental status: unresponseive. Pain control: no s/s of distress. Vital Signs Assessment: post-procedure vital signs reviewed and stable Respiratory status: patient on ventilator - see flowsheet for VS Cardiovascular status: stable Postop Assessment: no apparent nausea or vomiting Anesthetic complications: no    Last Vitals:  Vitals:   05/28/2017 2100 06/07/2017 2115  BP: 100/65 109/66  Pulse: 72 72  Resp: 16 16  Temp: 36.7 C   SpO2: 99% 99%    Last Pain:  Vitals:   05/17/2017 2100  TempSrc: Axillary  PainSc:                  Catheryn Baconyan P Brigitta Pricer

## 2017-06-03 NOTE — ED Provider Notes (Signed)
[facility] EMERGENCY DEPARTMENT Provider Note   CSN: [id number] Arrival date & time: 06/21/17  1125     History   Chief Complaint No chief complaint on file.   HPI Adam Hull is a 83 y.o. male.  HPI  Level 5 caveat secondary to dementia and language barrier.  Patient is primarily [place] speaking with an unusual dialect, and history is via friends and nursing.  Patient apparently was altered since last evening 6 PM where he was noted by friends at the room he was living in to be walking around naked in the house.  This morning he put his clothing on all wrong.  [facility] and there at the facility was noted to be slow in answering and at 1015 when they evaluated him he seemed to have decreased coordination on the left side of his body and some left-sided neglect.  By the time EMS got there he was nonfocal.  He was not a stroke activation on arrival here.  Here he is awake and is able to say yes no but very limited in history.  We are attempting to get an interpreter and family.   Past Medical History:  Diagnosis Date  . Alzheimer's dementia without behavioral disturbance   . Arthritis   . Diabetes mellitus without complication (HCC)   . Hyperlipidemia   . Hypertension   . Vitamin D deficiency     There are no active problems to display for this patient.   History reviewed. No pertinent surgical history.     Home Medications    Prior to Admission medications   Not on File    Family History History reviewed. No pertinent family history.  Social History Social History   Tobacco Use  . Smoking status: Unknown If Ever Smoked  Substance Use Topics  . Alcohol use: Not on file  . Drug use: Not on file     Allergies   Patient has no known allergies.   Review of Systems Review of Systems  Unable to perform ROS: Acuity of condition     Physical Exam Updated Vital Signs BP (!) 160/91   Pulse 71   Temp  98.6 F (37 C) (Oral)   Resp (!) 28   SpO2 94%   Physical Exam  Constitutional: He appears well-developed and well-nourished.  HENT:  Head: Normocephalic and atraumatic.  Eyes: Conjunctivae are normal.  Neck: Neck supple.  Cardiovascular: Normal rate and regular rhythm.  No murmur heard. Pulmonary/Chest: Effort normal and breath sounds normal. No respiratory distress.  Abdominal: Soft. There is no tenderness.  Musculoskeletal: He exhibits no edema.  Neurological: He is alert. He has normal strength. No cranial nerve deficit or sensory deficit. GCS eye subscore is 4. GCS verbal subscore is 5. GCS motor subscore is 6.  With language barrier patient's neuro exam was difficult.  Interpreter here now who states the patient is alert and making sense.  Still having difficulty doing any kind of cerebellar testing and he gives way and does not follow full testing on the motor strength but definitely appears to be intact and strength and showing no signs of left-sided neglect.  Skin: Skin is warm and dry.  Psychiatric: He has a normal mood and affect.  Nursing note and vitals reviewed.    ED Treatments / Results  Labs (all labs ordered are listed, but only abnormal results are displayed) Labs Reviewed  ETHANOL  PROTIME-INR  APTT  CBC  DIFFERENTIAL  COMPREHENSIVE METABOLIC PANEL  RAPID URINE DRUG SCREEN, HOSP PERFORMED  URINALYSIS, ROUTINE W REFLEX MICROSCOPIC  I-STAT CHEM 8, ED  I-STAT TROPONIN, ED    EKG  EKG Interpretation  Date/Time:  Friday 05/15/2017 11:30:00 EST Ventricular Rate:  73 PR Interval:    QRS Duration: 82 QT Interval:  399 QTC Calculation: 440 R Axis:   -23 Text Interpretation:  Sinus rhythm Probable left atrial enlargement Probable left ventricular hypertrophy Confirmed by Donnetta Hutching ([id number]) on 04-Jun-2017 11:45:56 AM Also confirmed by Donnetta Hutching ([id number])  on 04-Jun-2017 11:46:16 AM       Radiology Ct Angio Head W Or Wo Contrast  Result Date:  04-Jun-2017 CLINICAL DATA:  Left-sided weakness while at physical therapy today. EXAM: CT HEAD WITHOUT CONTRAST CT ANGIOGRAPHY OF THE HEAD AND NECK TECHNIQUE: Contiguous axial images were obtained from the base of the skull through the vertex without intravenous contrast. Multidetector CT imaging of the head and neck was performed using the standard protocol during bolus administration of intravenous contrast. Multiplanar CT image reconstructions and MIPs were obtained to evaluate the vascular anatomy. Carotid stenosis measurements (when applicable) are obtained utilizing NASCET criteria, using the distal internal carotid diameter as the denominator. CONTRAST:  50mL ISOVUE-370 IOPAMIDOL (ISOVUE-370) INJECTION 76% COMPARISON:  None. FINDINGS: CT HEAD Brain: Generalized atrophy. Chronic small-vessel ischemic changes of the cerebral hemispheric white matter. No sign of acute infarction, mass lesion, hemorrhage, hydrocephalus or extra-axial collection. Vascular: There is atherosclerotic calcification of the major vessels at the base of the brain. Skull: Normal Sinuses/Orbits: Negative CTA NECK Aortic arch: Aortic atherosclerosis.  No aneurysm or dissection. Right carotid system: Common carotid artery widely patent to the bifurcation. Carotid bifurcation is normal without soft or calcified plaque. Cervical internal carotid artery is tortuous but widely patent. Left carotid system: Common carotid artery widely patent to the bifurcation. Carotid bifurcation is normal without soft or calcified plaque. Cervical ICA is tortuous but otherwise normal. Vertebral arteries:Right vertebral artery is dominant. The origin is widely patent. The vessel is widely patent through the cervical region. Left vertebral artery is occluded at its origin but shows some reconstitution by cervical collaterals. Skeleton: Ordinary mid cervical spondylosis. Other neck: No mass or lymphadenopathy. Upper lobes are clear. There is either stenosis or  partial thrombosis the left subclavian/innominate vein. CTA HEAD Anterior circulation: Both internal carotid arteries are patent through the skull base and siphon regions. There is ordinary peripheral atherosclerotic calcification in the carotid siphon regions. No stenosis greater than 30% is suspected. The anterior and middle cerebral vessels are approximately patent without proximal stenosis, aneurysm or vascular malformation. There are missing/hypoperfusion in M2 and M3 regions on the right suggesting embolic disease in the right MCA territory. Posterior circulation: Right vertebral artery is dominant, supplying the basilar. Left vertebral artery is a small vessel that terminates in PICA. Basilar artery shows multiple serial stenoses. Flow is present within the superior cerebellar and left posterior cerebral artery from the posterior circulation. Right PCA receives it supply from the anterior circulation. Venous sinuses: Patent and normal. Anatomic variants: None significant. Delayed phase: No abnormal enhancement. IMPRESSION: Head CT does not show any acute ischemic change at this moment. However, there appears to be embolic disease to the right MCA territory in the M2 to M3 vessels. Consider CT perfusion study. Left vertebral artery occluded proximally with reconstitution of a small vessel in the upper cervical region. Serial basilar artery stenoses. These results were called by telephone at the time of interpretation  on 2017-06-29 at 2:27 pm to Dr. Meridee Score , who verbally acknowledged these results. Electronically Signed   By: Paulina Fusi M.D.   On: June 29, 2017 14:30   Ct Head Wo Contrast  Result Date: 06/29/17 CLINICAL DATA:  83 y/o  M; post IR exam. EXAM: CT HEAD WITHOUT CONTRAST TECHNIQUE: Contiguous axial images were obtained from the base of the skull through the vertex without intravenous contrast. COMPARISON:  June 29, 2017 MRI head. 2017/06/29 CT head and CTA head. 2017-06-29 cerebral  angiogram. FINDINGS: Brain: Decreased high attenuation material throughout subarachnoid and intraventricular spaces concentrated basilar and posterior fossa cisterns. Extra-axial material in right MCA station measures 73 HU, likely hemorrhage. Elsewhere extra-axial material measures greater than 100 HU, likely contrast and possibly underlying hemorrhage. Interval dilatation of the lateral and third ventricles compatible developing hydrocephalus, third ventricle measures 11 mm medial-lateral, previously 9 mm. Small foci of hypoattenuation in right temporal lobe corresponding to infarcts on MR. No large brain parenchymal hemorrhage identified. Vascular: Obscured by extra-axial material. Skull: Normal. Negative for fracture or focal lesion. Sinuses/Orbits: Mild diffuse paranasal sinus mucosal thickening. Partial opacification of left mastoid air cells. Orbits are unremarkable. Other: None. IMPRESSION: 1. High attenuation material throughout subarachnoid and intraventricular spaces. In right MCA cistern there is likely hemorrhage. Elsewhere material may represent contrast and/or hemorrhage. 2. Interval development of mild hydrocephalus.  No herniation. 3. Small foci of infarction in right temporal lobe as seen on MRI. No brain parenchymal hemorrhage identified. These results were called by telephone at the time of interpretation on Jun 29, 2017 at 8:07 pm to Dr. Amada Jupiter, who verbally acknowledged these results. Electronically Signed   By: Mitzi Hansen M.D.   On: 2017/06/29 20:08   Ct Head Wo Contrast  Result Date: 06/29/2017 CLINICAL DATA:  Left-sided weakness while at physical therapy today. EXAM: CT HEAD WITHOUT CONTRAST CT ANGIOGRAPHY OF THE HEAD AND NECK TECHNIQUE: Contiguous axial images were obtained from the base of the skull through the vertex without intravenous contrast. Multidetector CT imaging of the head and neck was performed using the standard protocol during bolus administration of  intravenous contrast. Multiplanar CT image reconstructions and MIPs were obtained to evaluate the vascular anatomy. Carotid stenosis measurements (when applicable) are obtained utilizing NASCET criteria, using the distal internal carotid diameter as the denominator. CONTRAST:  50mL ISOVUE-370 IOPAMIDOL (ISOVUE-370) INJECTION 76% COMPARISON:  None. FINDINGS: CT HEAD Brain: Generalized atrophy. Chronic small-vessel ischemic changes of the cerebral hemispheric white matter. No sign of acute infarction, mass lesion, hemorrhage, hydrocephalus or extra-axial collection. Vascular: There is atherosclerotic calcification of the major vessels at the base of the brain. Skull: Normal Sinuses/Orbits: Negative CTA NECK Aortic arch: Aortic atherosclerosis.  No aneurysm or dissection. Right carotid system: Common carotid artery widely patent to the bifurcation. Carotid bifurcation is normal without soft or calcified plaque. Cervical internal carotid artery is tortuous but widely patent. Left carotid system: Common carotid artery widely patent to the bifurcation. Carotid bifurcation is normal without soft or calcified plaque. Cervical ICA is tortuous but otherwise normal. Vertebral arteries:Right vertebral artery is dominant. The origin is widely patent. The vessel is widely patent through the cervical region. Left vertebral artery is occluded at its origin but shows some reconstitution by cervical collaterals. Skeleton: Ordinary mid cervical spondylosis. Other neck: No mass or lymphadenopathy. Upper lobes are clear. There is either stenosis or partial thrombosis the left subclavian/innominate vein. CTA HEAD Anterior circulation: Both internal carotid arteries are patent through the skull base and siphon regions. There is ordinary peripheral  atherosclerotic calcification in the carotid siphon regions. No stenosis greater than 30% is suspected. The anterior and middle cerebral vessels are approximately patent without proximal  stenosis, aneurysm or vascular malformation. There are missing/hypoperfusion in M2 and M3 regions on the right suggesting embolic disease in the right MCA territory. Posterior circulation: Right vertebral artery is dominant, supplying the basilar. Left vertebral artery is a small vessel that terminates in PICA. Basilar artery shows multiple serial stenoses. Flow is present within the superior cerebellar and left posterior cerebral artery from the posterior circulation. Right PCA receives it supply from the anterior circulation. Venous sinuses: Patent and normal. Anatomic variants: None significant. Delayed phase: No abnormal enhancement. IMPRESSION: Head CT does not show any acute ischemic change at this moment. However, there appears to be embolic disease to the right MCA territory in the M2 to M3 vessels. Consider CT perfusion study. Left vertebral artery occluded proximally with reconstitution of a small vessel in the upper cervical region. Serial basilar artery stenoses. These results were called by telephone at the time of interpretation on 06-29-2017 at 2:27 pm to Dr. Meridee Score , who verbally acknowledged these results. Electronically Signed   By: Paulina Fusi M.D.   On: 2017/06/29 14:30   Ct Angio Neck W Or Wo Contrast  Result Date: 06/29/17 CLINICAL DATA:  Left-sided weakness while at physical therapy today. EXAM: CT HEAD WITHOUT CONTRAST CT ANGIOGRAPHY OF THE HEAD AND NECK TECHNIQUE: Contiguous axial images were obtained from the base of the skull through the vertex without intravenous contrast. Multidetector CT imaging of the head and neck was performed using the standard protocol during bolus administration of intravenous contrast. Multiplanar CT image reconstructions and MIPs were obtained to evaluate the vascular anatomy. Carotid stenosis measurements (when applicable) are obtained utilizing NASCET criteria, using the distal internal carotid diameter as the denominator. CONTRAST:  50mL  ISOVUE-370 IOPAMIDOL (ISOVUE-370) INJECTION 76% COMPARISON:  None. FINDINGS: CT HEAD Brain: Generalized atrophy. Chronic small-vessel ischemic changes of the cerebral hemispheric white matter. No sign of acute infarction, mass lesion, hemorrhage, hydrocephalus or extra-axial collection. Vascular: There is atherosclerotic calcification of the major vessels at the base of the brain. Skull: Normal Sinuses/Orbits: Negative CTA NECK Aortic arch: Aortic atherosclerosis.  No aneurysm or dissection. Right carotid system: Common carotid artery widely patent to the bifurcation. Carotid bifurcation is normal without soft or calcified plaque. Cervical internal carotid artery is tortuous but widely patent. Left carotid system: Common carotid artery widely patent to the bifurcation. Carotid bifurcation is normal without soft or calcified plaque. Cervical ICA is tortuous but otherwise normal. Vertebral arteries:Right vertebral artery is dominant. The origin is widely patent. The vessel is widely patent through the cervical region. Left vertebral artery is occluded at its origin but shows some reconstitution by cervical collaterals. Skeleton: Ordinary mid cervical spondylosis. Other neck: No mass or lymphadenopathy. Upper lobes are clear. There is either stenosis or partial thrombosis the left subclavian/innominate vein. CTA HEAD Anterior circulation: Both internal carotid arteries are patent through the skull base and siphon regions. There is ordinary peripheral atherosclerotic calcification in the carotid siphon regions. No stenosis greater than 30% is suspected. The anterior and middle cerebral vessels are approximately patent without proximal stenosis, aneurysm or vascular malformation. There are missing/hypoperfusion in M2 and M3 regions on the right suggesting embolic disease in the right MCA territory. Posterior circulation: Right vertebral artery is dominant, supplying the basilar. Left vertebral artery is a small vessel  that terminates in PICA. Basilar artery shows multiple serial  stenoses. Flow is present within the superior cerebellar and left posterior cerebral artery from the posterior circulation. Right PCA receives it supply from the anterior circulation. Venous sinuses: Patent and normal. Anatomic variants: None significant. Delayed phase: No abnormal enhancement. IMPRESSION: Head CT does not show any acute ischemic change at this moment. However, there appears to be embolic disease to the right MCA territory in the M2 to M3 vessels. Consider CT perfusion study. Left vertebral artery occluded proximally with reconstitution of a small vessel in the upper cervical region. Serial basilar artery stenoses. These results were called by telephone at the time of interpretation on Jul 03, 2017 at 2:27 pm to Dr. Meridee Score , who verbally acknowledged these results. Electronically Signed   By: Paulina Fusi M.D.   On: July 03, 2017 14:30   Mr Brain Wo Contrast  Result Date: Jul 03, 2017 CLINICAL DATA:  Code stroke follow-up. The patient was sent directly to interventional radiology following examination. EXAM: MRI HEAD WITHOUT CONTRAST TECHNIQUE: Multiplanar, multiecho pulse sequences of the brain and surrounding structures were obtained without intravenous contrast. COMPARISON:  Head CT and CTA/perfusion 03-Jul-2017 FINDINGS: Axial diffusion weighted imaging and axial T2-weighted FLAIR imaging were performed. There is multifocal abnormal diffusion restriction within the posterior right MCA territory, much less than the volume of penumbra identified on the perfusion study. Multifocal periventricular white matter hyperintensity, most often a result of chronic microvascular ischemia. No acute hemorrhage or mass effect. IMPRESSION: Multiple areas of ischemic infarct within the posterior right MCA territory, but much smaller than the volume of penumbra identified on the perfusion study. Electronically Signed   By: Deatra Robinson M.D.   On:  2017-07-03 16:08   Ct Cerebral Perfusion W Contrast  Result Date: 07/03/17 CLINICAL DATA:  83 year old male with right MCA M2/M3 thromboembolic disease suspected on CTA head and neck today performed for stroke-like symptoms. CT perfusion requested to evaluate treatment options. EXAM: CT PERFUSION BRAIN TECHNIQUE: Multiphase CT imaging of the brain was performed following IV bolus contrast injection. Subsequent parametric perfusion maps were calculated using RAPID software. CONTRAST:  40mL ISOVUE-370 IOPAMIDOL (ISOVUE-370) INJECTION 76% COMPARISON:  CTA head and neck 1358 hr today. FINDINGS: CT Brain Perfusion Findings: CBF (<30%) Volume: None. Perfusion (Tmax>6.0s) volume: 106  mL Mismatch Volume: 106 mL Mismatch ratio: Infant. Infarction Location:No core infarct, but right MCA territory penumbra. Other findings: CT perfusion source images demonstrate the same pattern of abnormal right MCA branches as on the earlier CTA. There seems to be a short right M1 segment, early bifurcation. It is unclear whether or not there is focal thrombus at the right MCA bifurcation. IMPRESSION: 1. No core infarct detected by CT perfusion, which seems valid in light of normal ASPECTS thus far. 2. Right MCA territory penumbra estimated at 106 mL. 3. This study was reviewed in person with Dr. Milon Dikes on 07/03/2017 at 1540 hrs. Electronically Signed   By: Odessa Fleming M.D.   On: 07/03/17 15:56   Dg Chest Port 1 View  Result Date: 07-03-2017 CLINICAL DATA:  Ventilator dependent EXAM: PORTABLE CHEST 1 VIEW COMPARISON:  None. FINDINGS: Endotracheal tube tip is about 3.8 cm superior to the carina. Esophageal tube tip is below the diaphragm but is non included. Minimal atelectasis at the bases. No pleural effusion. Normal heart size. Prominent aortic knob measuring up to 4.1 cm. No pneumothorax. IMPRESSION: 1.  Et tube tip 3.8 cm above carina. 2.  Mild bibasilar atelectasis. Electronically Signed   By: Jasmine Pang M.D.   On:  07/03/17  21:09    Procedures .Critical Care Performed by: Terrilee FilesButler, Fusaye Wachtel C, MD Authorized by: Terrilee FilesButler, Amirra Herling C, MD   Critical care provider statement:    Critical care time (minutes):  60   Critical care time was exclusive of:  Separately billable procedures and treating other patients and teaching time   Critical care was necessary to treat or prevent imminent or life-threatening deterioration of the following conditions:  CNS failure or compromise   Critical care was time spent personally by me on the following activities:  Development of treatment plan with patient or surrogate, discussions with consultants, evaluation of patient's response to treatment, examination of patient, obtaining history from patient or surrogate, ordering and performing treatments and interventions, ordering and review of laboratory studies, ordering and review of radiographic studies, pulse oximetry and re-evaluation of patient's condition   I assumed direction of critical care for this patient from another provider in my specialty: no     (including critical care time)  Medications Ordered in ED Medications - No data to display   Initial Impression / Assessment and Plan / ED Course  I have reviewed the triage vital signs and the nursing notes.  Pertinent labs & imaging results that were available during my care of the patient were reviewed by me and considered in my medical decision making (see chart for details).  Clinical Course as of Jun 04 1611  Fri Jun 03, 2017  1508 Neurology here to evaluate the patient.  [MB]  1508 I had repeated the neuro exam with the interpreter and essentially at the same, no focal findings although still limited due to language barrier even with the interpreter.  [MB]  1559 Eval by stroke attending Dr. Wilford CornerArora, who sent the patient to MRI.  There is some concern that the patient may have some embolic phenomenon and he is going to angiogram for an angiogram and potentially an  intervention if there is a vessel that can be intervened on.  The patient will ultimately go to neuro ICU and is not returning to Ed.   [MB]    Clinical Course User Index [MB] Terrilee FilesButler, Seneca Hoback C, MD      Final Clinical Impressions(s) / ED Diagnoses   Final diagnoses:  Stroke [facility](HCC)  Stroke (cerebrum) [facility](HCC)  Ventilator dependent [facility](HCC)    ED Discharge Orders    None       Terrilee FilesButler, Augustin Bun C, MD 05/19/2017 1020

## 2017-06-03 NOTE — Progress Notes (Addendum)
CSW received phone call from ClearyHolly with [facility]. Pt is involved with [facility]. On call nurse number for [facility] is [phone] if needed. Holly informed CSW that pt speaks [place].   Montine CircleKelsy Tanielle Emigh, Silverio LayLCSWA [facility]  [phone]

## 2017-06-03 NOTE — ED Notes (Signed)
Pt taken to the  ?? Mri by the stroke team  approx 15 minutes ago

## 2017-06-03 NOTE — Progress Notes (Signed)
IR notified of Code stroke call on this pt. This nurse went to ED to retrieve pt, pt is in MRI at this time. Dr Rory Percy states that stroke nurse will deliver pt to IR suite. While traveling back to IR suite met this pt and stroke nurse in hallway. Received report from stroke RN and ED RN.

## 2017-06-03 NOTE — Anesthesia Procedure Notes (Signed)
Procedure Name: Intubation Date/Time: 05/22/2017 4:23 PM Performed by: Lovie Cholock, Glover Capano K, CRNA Pre-anesthesia Checklist: Patient identified, Emergency Drugs available, Suction available and Patient being monitored Patient Re-evaluated:Patient Re-evaluated prior to induction Oxygen Delivery Method: Circle System Utilized Preoxygenation: Pre-oxygenation with 100% oxygen Induction Type: IV induction, Rapid sequence and Cricoid Pressure applied Laryngoscope Size: Miller and 2 Grade View: Grade I Tube type: Oral Tube size: 7.5 mm Number of attempts: 1 Airway Equipment and Method: Stylet and Oral airway Placement Confirmation: ETT inserted through vocal cords under direct vision,  positive ETCO2 and breath sounds checked- equal and bilateral Secured at: 22 cm Tube secured with: Tape Dental Injury: Teeth and Oropharynx as per pre-operative assessment

## 2017-06-03 NOTE — Progress Notes (Signed)
Apnea test performed. ABG pre-test was 7.32 / 30 / 159 / 16 / 99%. During the test patient had complete apnea throughout. HR, BP, and Pox stable throughout. Repeat ABG after 8 minutes showed 7.26 / 47 / 221. This comes close to meeting criteria for brain death but not quite as need pCO2 >60 or change of 20 from baseline and pH < 7.25.   Recommend apnea test be performed again in the AM.

## 2017-06-03 NOTE — Procedures (Signed)
S/p bilateral common carotid arteriograms .with partial recanalization of occluded inf division of Rt MCA   WITHOUT  Thrombectomy or mechanical manipulation  Associated with  extravasation of contrast. Procedure aborted.. Aggressive hemodynamic management ensued.Patient given x 1 unit of platelets.Received no IVTPA.

## 2017-06-03 NOTE — ED Triage Notes (Signed)
Pt presents with report of L sided weakness while at physical therapy today.  Per paramedic, therapist reports pt became weak on L side at 1015 and by the time EMS arrived, pt was normal and back to baseline.  Pt is poor historian, speaks vietnamese and has dementia.

## 2017-06-03 NOTE — ED Notes (Signed)
Patient transported to CT 

## 2017-06-03 NOTE — ED Notes (Signed)
RN currently contacting [facility] to confirm story and [facility]

## 2017-06-03 NOTE — Transfer of Care (Signed)
Immediate Anesthesia Transfer of Care Note  Patient: Adam Hull  Procedure(s) Performed: IR WITH ANESTHESIA (N/A )  Patient Location: ICU  Anesthesia Type:General  Level of Consciousness: Patient remains intubated per anesthesia plan  Airway & Oxygen Therapy: Patient remains intubated per anesthesia plan and Patient placed on Ventilator (see vital sign flow sheet for setting)  Post-op Assessment: Report given to RN and Post -op Vital signs reviewed and stable  Post vital signs: Reviewed and stable  Last Vitals:  Vitals:   05/24/2017 1915 05/27/2017 1946  BP:  106/60  Pulse:  76  Resp:  16  Temp:    SpO2: 97% 99%    Last Pain:  Vitals:   05/30/2017 1445  TempSrc:   PainSc: 0-No pain         Complications: No apparent anesthesia complications

## 2017-06-03 NOTE — Progress Notes (Signed)
Patient transported to 4N from CT on 100% Fi02 without incident.  Patient placed on vent, suctioned via ETT, patient has no cough.  ETT does not have subglottic suction.

## 2017-06-03 NOTE — Consult Note (Signed)
Name: Adam Hull MRN: [id number] DOB: March 04, 1935    ADMISSION DATE:  06/29/2017 CONSULTATION DATE:  06-29-2017  REFERRING MD :  Dr. Wilford Corner   CHIEF COMPLAINT:  CVA  HISTORY OF PRESENT ILLNESS:   83 year old male with PMH of Alzheimer's dementia, DM, HTN, HLD  Presents to ED on 2/22 with left sided weakness and right sided gaze [facility] Upon arrival patient with no focal findings. Neurology consulted. No TPA given due to unknown last normal. MRI with embolic disease to the right MCA territory in the M2 to M3 vessels. Taken to IR for thrombectomy as MRI also appeared to have clot burden.   During procedure loaded with Brilinta. Initial run of angio showed right sided dural AVF, repeat CT on table showed hemorrhage anterolateral to the vessel with scattered SAH with occlusion of the right transverse sinus noted. During this time it was noted that patient's left pupil became fixed and dilated. Repeat CT was done which revealed extensive subarachnoid bleed. Last run prior to retracting microcatheter noted no blood flow to the right or left side of the brain. Because patient is still under the effects of anesthetics, a declaration of brain death could not be made. PCCM asked to consult.   SIGNIFICANT EVENTS  2/22 > Presents to ED   STUDIES:  CTA Head 2/22 > Head CT does not show any acute ischemic change at this moment. However, there appears to be embolic disease to the right MCA territory in the M2 to M3 vessels. Consider CT perfusion study.Left vertebral artery occluded proximally with reconstitution of a small vessel in the upper cervical region. Serial basilar artery Stenoses. MR Brain 2/22 > Axial diffusion weighted imaging and axial T2-weighted FLAIR imaging were performed. There is multifocal abnormal diffusion restriction within the posterior right MCA territory, much less than the volume of penumbra identified on the perfusion study. Multifocal periventricular white  matter hyperintensity, most often a result of chronic microvascular ischemia. No acute hemorrhage or mass effect. CT Head 2/22 > 1. High attenuation material throughout subarachnoid and intraventricular spaces. In right MCA cistern there is likely hemorrhage. Elsewhere material may represent contrast and/or Hemorrhage. 2. Interval development of mild hydrocephalus.  No herniation.3. Small foci of infarction in right temporal lobe as seen on MRI. No brain parenchymal hemorrhage identified. CXR 2/22 > Endotracheal tube tip is about 3.8 cm superior to the carina. Esophageal tube tip is below the diaphragm but is non included. Minimal atelectasis at the bases. No pleural effusion. Normal heart size. Prominent aortic knob measuring up to 4.1 cm. No pneumothorax  PAST MEDICAL HISTORY :   has a past medical history of Alzheimer's dementia without behavioral disturbance, Arthritis, Diabetes mellitus without complication (HCC), Hyperlipidemia, Hypertension, and Vitamin D deficiency.  has no past surgical history on file. Prior to Admission medications   Not on File   No Known Allergies  FAMILY HISTORY:  family history is not on file. SOCIAL HISTORY:    REVIEW OF SYSTEMS:   Unable to review as patient is intubated and unresponsive   SUBJECTIVE:   VITAL SIGNS: Temp:  [95.3 F (35.2 C)-98.6 F (37 C)] 95.3 F (35.2 C) (02/22 2000) Pulse Rate:  [56-76] 76 (02/22 1946) Resp:  [11-28] 16 (02/22 1946) BP: (106-179)/(60-99) 106/60 (02/22 1946) SpO2:  [93 %-99 %] 99 % (02/22 1946) FiO2 (%):  [70 %] 70 % (02/22 1946) Weight:  [64.7 kg (142 lb 10.2 oz)] 64.7 kg (142 lb 10.2 oz) (  02/22 1946)  PHYSICAL EXAMINATION: General:  Elderly male, on vent  Neuro:  Pupils 5 mm, non-reactive HEENT:  ETT in place  Cardiovascular:  RRR, no MRG  Lungs:  Clear breath sounds, no wheeze/crackles  Abdomen:  Non-distended, active bowel sounds Musculoskeletal:  -edema  Skin:  Warm, dry   Recent Labs  Lab  06/01/2017 1218 05/20/2017 1234  NA 136 137  K 4.7 4.7  CL 100* 100*  CO2 26  --   BUN 13 17  CREATININE 1.63* 1.60*  GLUCOSE 159* 154*   Recent Labs  Lab 06/02/2017 1218 05/30/2017 1234  HGB 16.4 17.7*  HCT 49.0 52.0  WBC 7.4  --   PLT 151  --    Ct Angio Head W Or Wo Contrast  Result Date: 06/08/2017 CLINICAL DATA:  Left-sided weakness while at physical therapy today. EXAM: CT HEAD WITHOUT CONTRAST CT ANGIOGRAPHY OF THE HEAD AND NECK TECHNIQUE: Contiguous axial images were obtained from the base of the skull through the vertex without intravenous contrast. Multidetector CT imaging of the head and neck was performed using the standard protocol during bolus administration of intravenous contrast. Multiplanar CT image reconstructions and MIPs were obtained to evaluate the vascular anatomy. Carotid stenosis measurements (when applicable) are obtained utilizing NASCET criteria, using the distal internal carotid diameter as the denominator. CONTRAST:  50mL ISOVUE-370 IOPAMIDOL (ISOVUE-370) INJECTION 76% COMPARISON:  None. FINDINGS: CT HEAD Brain: Generalized atrophy. Chronic small-vessel ischemic changes of the cerebral hemispheric white matter. No sign of acute infarction, mass lesion, hemorrhage, hydrocephalus or extra-axial collection. Vascular: There is atherosclerotic calcification of the major vessels at the base of the brain. Skull: Normal Sinuses/Orbits: Negative CTA NECK Aortic arch: Aortic atherosclerosis.  No aneurysm or dissection. Right carotid system: Common carotid artery widely patent to the bifurcation. Carotid bifurcation is normal without soft or calcified plaque. Cervical internal carotid artery is tortuous but widely patent. Left carotid system: Common carotid artery widely patent to the bifurcation. Carotid bifurcation is normal without soft or calcified plaque. Cervical ICA is tortuous but otherwise normal. Vertebral arteries:Right vertebral artery is dominant. The origin is widely  patent. The vessel is widely patent through the cervical region. Left vertebral artery is occluded at its origin but shows some reconstitution by cervical collaterals. Skeleton: Ordinary mid cervical spondylosis. Other neck: No mass or lymphadenopathy. Upper lobes are clear. There is either stenosis or partial thrombosis the left subclavian/innominate vein. CTA HEAD Anterior circulation: Both internal carotid arteries are patent through the skull base and siphon regions. There is ordinary peripheral atherosclerotic calcification in the carotid siphon regions. No stenosis greater than 30% is suspected. The anterior and middle cerebral vessels are approximately patent without proximal stenosis, aneurysm or vascular malformation. There are missing/hypoperfusion in M2 and M3 regions on the right suggesting embolic disease in the right MCA territory. Posterior circulation: Right vertebral artery is dominant, supplying the basilar. Left vertebral artery is a small vessel that terminates in PICA. Basilar artery shows multiple serial stenoses. Flow is present within the superior cerebellar and left posterior cerebral artery from the posterior circulation. Right PCA receives it supply from the anterior circulation. Venous sinuses: Patent and normal. Anatomic variants: None significant. Delayed phase: No abnormal enhancement. IMPRESSION: Head CT does not show any acute ischemic change at this moment. However, there appears to be embolic disease to the right MCA territory in the M2 to M3 vessels. Consider CT perfusion study. Left vertebral artery occluded proximally with reconstitution of a small vessel in the upper  cervical region. Serial basilar artery stenoses. These results were called by telephone at the time of interpretation on 05/13/2017 at 2:27 pm to Dr. Meridee Score , who verbally acknowledged these results. Electronically Signed   By: Paulina Fusi M.D.   On: 05/26/2017 14:30   Ct Head Wo Contrast  Result Date:  05/21/2017 CLINICAL DATA:  83 y/o  M; post IR exam. EXAM: CT HEAD WITHOUT CONTRAST TECHNIQUE: Contiguous axial images were obtained from the base of the skull through the vertex without intravenous contrast. COMPARISON:  05/13/2017 MRI head. 05/26/2017 CT head and CTA head. 05/27/2017 cerebral angiogram. FINDINGS: Brain: Decreased high attenuation material throughout subarachnoid and intraventricular spaces concentrated basilar and posterior fossa cisterns. Extra-axial material in right MCA station measures 73 HU, likely hemorrhage. Elsewhere extra-axial material measures greater than 100 HU, likely contrast and possibly underlying hemorrhage. Interval dilatation of the lateral and third ventricles compatible developing hydrocephalus, third ventricle measures 11 mm medial-lateral, previously 9 mm. Small foci of hypoattenuation in right temporal lobe corresponding to infarcts on MR. No large brain parenchymal hemorrhage identified. Vascular: Obscured by extra-axial material. Skull: Normal. Negative for fracture or focal lesion. Sinuses/Orbits: Mild diffuse paranasal sinus mucosal thickening. Partial opacification of left mastoid air cells. Orbits are unremarkable. Other: None. IMPRESSION: 1. High attenuation material throughout subarachnoid and intraventricular spaces. In right MCA cistern there is likely hemorrhage. Elsewhere material may represent contrast and/or hemorrhage. 2. Interval development of mild hydrocephalus.  No herniation. 3. Small foci of infarction in right temporal lobe as seen on MRI. No brain parenchymal hemorrhage identified. These results were called by telephone at the time of interpretation on 05/27/2017 at 8:07 pm to Dr. Amada Jupiter, who verbally acknowledged these results. Electronically Signed   By: Mitzi Hansen M.D.   On: 05/24/2017 20:08   Ct Head Wo Contrast  Result Date: 06/03/2017 CLINICAL DATA:  Left-sided weakness while at physical therapy today. EXAM: CT HEAD WITHOUT  CONTRAST CT ANGIOGRAPHY OF THE HEAD AND NECK TECHNIQUE: Contiguous axial images were obtained from the base of the skull through the vertex without intravenous contrast. Multidetector CT imaging of the head and neck was performed using the standard protocol during bolus administration of intravenous contrast. Multiplanar CT image reconstructions and MIPs were obtained to evaluate the vascular anatomy. Carotid stenosis measurements (when applicable) are obtained utilizing NASCET criteria, using the distal internal carotid diameter as the denominator. CONTRAST:  50mL ISOVUE-370 IOPAMIDOL (ISOVUE-370) INJECTION 76% COMPARISON:  None. FINDINGS: CT HEAD Brain: Generalized atrophy. Chronic small-vessel ischemic changes of the cerebral hemispheric white matter. No sign of acute infarction, mass lesion, hemorrhage, hydrocephalus or extra-axial collection. Vascular: There is atherosclerotic calcification of the major vessels at the base of the brain. Skull: Normal Sinuses/Orbits: Negative CTA NECK Aortic arch: Aortic atherosclerosis.  No aneurysm or dissection. Right carotid system: Common carotid artery widely patent to the bifurcation. Carotid bifurcation is normal without soft or calcified plaque. Cervical internal carotid artery is tortuous but widely patent. Left carotid system: Common carotid artery widely patent to the bifurcation. Carotid bifurcation is normal without soft or calcified plaque. Cervical ICA is tortuous but otherwise normal. Vertebral arteries:Right vertebral artery is dominant. The origin is widely patent. The vessel is widely patent through the cervical region. Left vertebral artery is occluded at its origin but shows some reconstitution by cervical collaterals. Skeleton: Ordinary mid cervical spondylosis. Other neck: No mass or lymphadenopathy. Upper lobes are clear. There is either stenosis or partial thrombosis the left subclavian/innominate vein. CTA HEAD Anterior circulation:  Both internal  carotid arteries are patent through the skull base and siphon regions. There is ordinary peripheral atherosclerotic calcification in the carotid siphon regions. No stenosis greater than 30% is suspected. The anterior and middle cerebral vessels are approximately patent without proximal stenosis, aneurysm or vascular malformation. There are missing/hypoperfusion in M2 and M3 regions on the right suggesting embolic disease in the right MCA territory. Posterior circulation: Right vertebral artery is dominant, supplying the basilar. Left vertebral artery is a small vessel that terminates in PICA. Basilar artery shows multiple serial stenoses. Flow is present within the superior cerebellar and left posterior cerebral artery from the posterior circulation. Right PCA receives it supply from the anterior circulation. Venous sinuses: Patent and normal. Anatomic variants: None significant. Delayed phase: No abnormal enhancement. IMPRESSION: Head CT does not show any acute ischemic change at this moment. However, there appears to be embolic disease to the right MCA territory in the M2 to M3 vessels. Consider CT perfusion study. Left vertebral artery occluded proximally with reconstitution of a small vessel in the upper cervical region. Serial basilar artery stenoses. These results were called by telephone at the time of interpretation on 06/02/2017 at 2:27 pm to Dr. Meridee Score , who verbally acknowledged these results. Electronically Signed   By: Paulina Fusi M.D.   On: 05/26/2017 14:30   Ct Angio Neck W Or Wo Contrast  Result Date: 05/13/2017 CLINICAL DATA:  Left-sided weakness while at physical therapy today. EXAM: CT HEAD WITHOUT CONTRAST CT ANGIOGRAPHY OF THE HEAD AND NECK TECHNIQUE: Contiguous axial images were obtained from the base of the skull through the vertex without intravenous contrast. Multidetector CT imaging of the head and neck was performed using the standard protocol during bolus administration of  intravenous contrast. Multiplanar CT image reconstructions and MIPs were obtained to evaluate the vascular anatomy. Carotid stenosis measurements (when applicable) are obtained utilizing NASCET criteria, using the distal internal carotid diameter as the denominator. CONTRAST:  50mL ISOVUE-370 IOPAMIDOL (ISOVUE-370) INJECTION 76% COMPARISON:  None. FINDINGS: CT HEAD Brain: Generalized atrophy. Chronic small-vessel ischemic changes of the cerebral hemispheric white matter. No sign of acute infarction, mass lesion, hemorrhage, hydrocephalus or extra-axial collection. Vascular: There is atherosclerotic calcification of the major vessels at the base of the brain. Skull: Normal Sinuses/Orbits: Negative CTA NECK Aortic arch: Aortic atherosclerosis.  No aneurysm or dissection. Right carotid system: Common carotid artery widely patent to the bifurcation. Carotid bifurcation is normal without soft or calcified plaque. Cervical internal carotid artery is tortuous but widely patent. Left carotid system: Common carotid artery widely patent to the bifurcation. Carotid bifurcation is normal without soft or calcified plaque. Cervical ICA is tortuous but otherwise normal. Vertebral arteries:Right vertebral artery is dominant. The origin is widely patent. The vessel is widely patent through the cervical region. Left vertebral artery is occluded at its origin but shows some reconstitution by cervical collaterals. Skeleton: Ordinary mid cervical spondylosis. Other neck: No mass or lymphadenopathy. Upper lobes are clear. There is either stenosis or partial thrombosis the left subclavian/innominate vein. CTA HEAD Anterior circulation: Both internal carotid arteries are patent through the skull base and siphon regions. There is ordinary peripheral atherosclerotic calcification in the carotid siphon regions. No stenosis greater than 30% is suspected. The anterior and middle cerebral vessels are approximately patent without proximal  stenosis, aneurysm or vascular malformation. There are missing/hypoperfusion in M2 and M3 regions on the right suggesting embolic disease in the right MCA territory. Posterior circulation: Right vertebral artery is dominant, supplying the  basilar. Left vertebral artery is a small vessel that terminates in PICA. Basilar artery shows multiple serial stenoses. Flow is present within the superior cerebellar and left posterior cerebral artery from the posterior circulation. Right PCA receives it supply from the anterior circulation. Venous sinuses: Patent and normal. Anatomic variants: None significant. Delayed phase: No abnormal enhancement. IMPRESSION: Head CT does not show any acute ischemic change at this moment. However, there appears to be embolic disease to the right MCA territory in the M2 to M3 vessels. Consider CT perfusion study. Left vertebral artery occluded proximally with reconstitution of a small vessel in the upper cervical region. Serial basilar artery stenoses. These results were called by telephone at the time of interpretation on 19-Jun-2017 at 2:27 pm to Dr. Meridee Score , who verbally acknowledged these results. Electronically Signed   By: Paulina Fusi M.D.   On: 06-19-2017 14:30   Mr Brain Wo Contrast  Result Date: 06-19-17 CLINICAL DATA:  Code stroke follow-up. The patient was sent directly to interventional radiology following examination. EXAM: MRI HEAD WITHOUT CONTRAST TECHNIQUE: Multiplanar, multiecho pulse sequences of the brain and surrounding structures were obtained without intravenous contrast. COMPARISON:  Head CT and CTA/perfusion 06/19/2017 FINDINGS: Axial diffusion weighted imaging and axial T2-weighted FLAIR imaging were performed. There is multifocal abnormal diffusion restriction within the posterior right MCA territory, much less than the volume of penumbra identified on the perfusion study. Multifocal periventricular white matter hyperintensity, most often a result of  chronic microvascular ischemia. No acute hemorrhage or mass effect. IMPRESSION: Multiple areas of ischemic infarct within the posterior right MCA territory, but much smaller than the volume of penumbra identified on the perfusion study. Electronically Signed   By: Deatra Robinson M.D.   On: 2017/06/19 16:08   Ct Cerebral Perfusion W Contrast  Result Date: 2017-06-19 CLINICAL DATA:  83 year old male with right MCA M2/M3 thromboembolic disease suspected on CTA head and neck today performed for stroke-like symptoms. CT perfusion requested to evaluate treatment options. EXAM: CT PERFUSION BRAIN TECHNIQUE: Multiphase CT imaging of the brain was performed following IV bolus contrast injection. Subsequent parametric perfusion maps were calculated using RAPID software. CONTRAST:  40mL ISOVUE-370 IOPAMIDOL (ISOVUE-370) INJECTION 76% COMPARISON:  CTA head and neck 1358 hr today. FINDINGS: CT Brain Perfusion Findings: CBF (<30%) Volume: None. Perfusion (Tmax>6.0s) volume: 106  mL Mismatch Volume: 106 mL Mismatch ratio: Infant. Infarction Location:No core infarct, but right MCA territory penumbra. Other findings: CT perfusion source images demonstrate the same pattern of abnormal right MCA branches as on the earlier CTA. There seems to be a short right M1 segment, early bifurcation. It is unclear whether or not there is focal thrombus at the right MCA bifurcation. IMPRESSION: 1. No core infarct detected by CT perfusion, which seems valid in light of normal ASPECTS thus far. 2. Right MCA territory penumbra estimated at 106 mL. 3. This study was reviewed in person with Dr. Milon Dikes on 06/19/2017 at 1540 hrs. Electronically Signed   By: Odessa Fleming M.D.   On: 06/19/2017 15:56    ASSESSMENT / PLAN:  Vent Dependent Respiratory Insufficieny in setting of right MCA Plan  -Vent Support -Trend ABG/CXR -Pulmonary Hygiene   Right MCA s/p IR Massive SAH with herniation, no blood flow noted to right/left side of brain with  suspected brain death  -LVO Right M2-3 segment, short segment in M1 that also appeared to have clot burden  -Loaded with Brilinta, given PLTs to reverse  H/O Dementia  Plan  -Per Neurology  -  Plans for brain death/apnea testing, received ROC for intubated at 1830, will perform train of 4 to unsure no continued presence of paralytic   H/O HTN, HLD Plan  -Cardiac Monitoring -Per Neurology keep systolic >100 (currently not requiring pressors)   H/O DM  Plan  -Trend glucose  -SSI   Jovita Kussmaul, AGACNP-BC [facility]  Pgr: [phone]  PCCM Pgr: [phone]

## 2017-06-03 NOTE — Progress Notes (Signed)
Paged Dr. Corliss Skainseveshwar, due to lvl 1 bleeding, no hematoma, no bruising at R Femoral site.  Angio-seal had become saturated.  Was ordered to apply pressure, keep extremity straight, and change the dressing.

## 2017-06-03 NOTE — ED Notes (Signed)
RN spoke with family, pt was found to be acting different last night and came to the diner table naked (which is out of the normal for him), pt states he fell last night and family says they did not know about it. This morning pt got dress and put a shirt on as pants and underwear and was walking slower than normal.   [facility] this [facility] and staff noticed he was not acting right. The NP assessed the pt and call 911 due to the pt having left sided weakness and a right sided gaze. Pt then was able to walk but was having intermittent confusion more than baseline.     RN was attempting to do assessment but having a difficult time communicating due to language difference, RN has contacted interrupter who is on their way.

## 2017-06-04 ENCOUNTER — Encounter (HOSPITAL_COMMUNITY): Payer: Self-pay

## 2017-06-04 ENCOUNTER — Inpatient Hospital Stay (HOSPITAL_COMMUNITY): Payer: Medicare (Managed Care)

## 2017-06-04 DIAGNOSIS — I63411 Cerebral infarction due to embolism of right middle cerebral artery: Principal | ICD-10-CM

## 2017-06-04 DIAGNOSIS — I351 Nonrheumatic aortic (valve) insufficiency: Secondary | ICD-10-CM

## 2017-06-04 DIAGNOSIS — J9601 Acute respiratory failure with hypoxia: Secondary | ICD-10-CM

## 2017-06-04 DIAGNOSIS — G935 Compression of brain: Secondary | ICD-10-CM

## 2017-06-04 DIAGNOSIS — G9382 Brain death: Secondary | ICD-10-CM

## 2017-06-04 DIAGNOSIS — I6601 Occlusion and stenosis of right middle cerebral artery: Secondary | ICD-10-CM

## 2017-06-04 DIAGNOSIS — I639 Cerebral infarction, unspecified: Secondary | ICD-10-CM

## 2017-06-04 LAB — BASIC METABOLIC PANEL
Anion gap: 11 (ref 5–15)
BUN: 16 mg/dL (ref 6–20)
CALCIUM: 8 mg/dL — AB (ref 8.9–10.3)
CHLORIDE: 106 mmol/L (ref 101–111)
CO2: 18 mmol/L — ABNORMAL LOW (ref 22–32)
CREATININE: 1.67 mg/dL — AB (ref 0.61–1.24)
GFR, EST AFRICAN AMERICAN: 43 mL/min — AB (ref >60)
GFR, EST NON AFRICAN AMERICAN: 37 mL/min — AB (ref >60)
Glucose, Bld: 202 mg/dL — ABNORMAL HIGH (ref 65–99)
Potassium: 4.3 mmol/L (ref 3.5–5.1)
SODIUM: 135 mmol/L (ref 135–145)

## 2017-06-04 LAB — ECHOCARDIOGRAM COMPLETE
AOASC: 36 cm
AVPHT: 812 ms
CHL CUP MV DEC (S): 285
E/e' ratio: 16.48
EWDT: 285 ms
FS: 26 % — AB (ref 28–44)
IV/PV OW: 1.1
LA ID, A-P, ES: 31 mm
LA vol A4C: 40.2 ml
LAVOL: 43.7 mL
LDCA: 3.14 cm2
LEFT ATRIUM END SYS DIAM: 31 mm
LV E/e' medial: 16.48
LV E/e'average: 16.48
LV PW d: 10 mm — AB (ref 0.6–1.1)
LV TDI E'MEDIAL: 3.24
LVELAT: 5.03 cm/s
LVOT diameter: 20 mm
Lateral S' vel: 15.2 cm/s
MV Peak grad: 3 mmHg
MV pk A vel: 117 m/s
MVAP: 2.62 cm2
MVPKEVEL: 82.9 m/s
MVSPHT: 84 ms
TAPSE: 20.3 mm
TDI e' lateral: 5.03
Weight: 2282.2 oz

## 2017-06-04 LAB — BPAM PLATELET PHERESIS
BLOOD PRODUCT EXPIRATION DATE: 201902242359
ISSUE DATE / TIME: 201902221757
Unit Type and Rh: 6200

## 2017-06-04 LAB — CBC WITH DIFFERENTIAL/PLATELET
BASOS PCT: 0 %
Basophils Absolute: 0 10*3/uL (ref 0.0–0.1)
EOS ABS: 0 10*3/uL (ref 0.0–0.7)
Eosinophils Relative: 0 %
HCT: 39.6 % (ref 39.0–52.0)
HEMOGLOBIN: 13 g/dL (ref 13.0–17.0)
Lymphocytes Relative: 11 %
Lymphs Abs: 0.8 10*3/uL (ref 0.7–4.0)
MCH: 28 pg (ref 26.0–34.0)
MCHC: 32.8 g/dL (ref 30.0–36.0)
MCV: 85.3 fL (ref 78.0–100.0)
MONOS PCT: 4 %
Monocytes Absolute: 0.3 10*3/uL (ref 0.1–1.0)
NEUTROS PCT: 85 %
Neutro Abs: 6.4 10*3/uL (ref 1.7–7.7)
PLATELETS: 155 10*3/uL (ref 150–400)
RBC: 4.64 MIL/uL (ref 4.22–5.81)
RDW: 13.8 % (ref 11.5–15.5)
WBC: 7.5 10*3/uL (ref 4.0–10.5)

## 2017-06-04 LAB — LIPID PANEL
CHOLESTEROL: 153 mg/dL (ref 0–200)
HDL: 42 mg/dL (ref >40)
LDL Cholesterol: 98 mg/dL (ref 0–99)
Total CHOL/HDL Ratio: 3.6 RATIO
Triglycerides: 66 mg/dL (ref <150)
VLDL: 13 mg/dL (ref 0–40)

## 2017-06-04 LAB — GLUCOSE, CAPILLARY
GLUCOSE-CAPILLARY: 192 mg/dL — AB (ref 65–99)
GLUCOSE-CAPILLARY: 179 mg/dL — AB (ref 65–99)

## 2017-06-04 LAB — PREPARE PLATELET PHERESIS: Unit division: 0

## 2017-06-04 LAB — BLOOD PRODUCT ORDER (VERBAL) VERIFICATION

## 2017-06-05 LAB — HEMOGLOBIN A1C
Hgb A1c MFr Bld: 7.1 % — ABNORMAL HIGH (ref 4.8–5.6)
MEAN PLASMA GLUCOSE: 157 mg/dL

## 2017-06-06 ENCOUNTER — Encounter (HOSPITAL_COMMUNITY): Payer: Self-pay | Admitting: Interventional Radiology

## 2017-06-10 NOTE — Progress Notes (Signed)
Family gathered at bedside. Request to not preform anymore testing, including brain death testing. Would like to withdrawal care at this time. Dr. Wallace CullensGray notified of these wishes.

## 2017-06-10 NOTE — Progress Notes (Signed)
Patient terminally extubated per MD order. No complications. Family at bedside. RT will continue to monitor. 

## 2017-06-10 NOTE — Progress Notes (Signed)
No heartbeat auscultated by myself and Guss BundeAshley Elliot, RN. Pt pronounced dead at 1422.  Family at bedside aware. Dr. Wallace CullensGray aware.

## 2017-06-10 NOTE — Progress Notes (Signed)
Apnea test performed for 8 minutes with 8L O2 via ETT.  AGB drawn immediately after apnea test was completed. Patient was apneic throughout. Patient's SATS remained 98% with no changes in HR or BP.  Patient placed back on vent on previous setting.

## 2017-06-10 NOTE — Progress Notes (Signed)
Paged and spoke with Dr. Amada JupiterKirkpatrick regarding starting pressors for patient.  New blood pressure parameters given.  MAP >70.

## 2017-06-10 NOTE — Progress Notes (Signed)
PULMONARY / CRITICAL CARE MEDICINE   Name: Adam Hull MRN: [id number] DOB: 03-19-1935    ADMISSION DATE:  06/27/2017    HISTORY OF PRESENT ILLNESS:   83 year old male with PMH of Alzheimer's dementia, DM, HTN, HLD  Presents to ED on 2/22 with left sided weakness and right sided gaze [facility] Upon arrival patient with no focal findings. Neurology consulted. No TPA given due to unknown last normal. MRI with embolic disease to the right MCA territory in the M2 to M3 vessels. Taken to IR for thrombectomy as MRI also appeared to have clot burden.   During procedure loaded with Brilinta. Initial run of angio showed right sided dural AVF, repeat CT on table showed hemorrhage anterolateral to the vessel with scattered SAH with occlusion of the right transverse sinus noted. During this time it was noted that patient's left pupil became fixed and dilated. Repeat CT was done which revealed extensive subarachnoid bleed. Last run prior to retracting microcatheter noted no blood flow to the right or left side of the brain. Because patient is still under the effects of anesthetics, a declaration of brain death could not be made. PCCM asked to consult.   After determining that the patient's rocuronium had been metabolized by confirming 4 out of 4 train-of-four and reviewing the records which show that only 100 mcg of fentanyl had been given at 1700 with the remainder of sedation for the procedure having been isoflurane, and apnea test was performed yesterday evening which showed the patient was completely apneic after 8 minutes but he did not achieve CO2 criteria for brain death.    He has received no sedation overnight and is entirely unresponsive for me.  He is normothermic at the time of my examination and has not developed polyuria.  Is not requiring pressors.     PAST MEDICAL HISTORY :  He  has a past medical history of Alzheimer's dementia without behavioral disturbance,  Arthritis, Diabetes mellitus without complication (HCC), Hyperlipidemia, Hypertension, and Vitamin D deficiency.  PAST SURGICAL HISTORY: He  has no past surgical history on file.  No Known Allergies  No current facility-administered medications on file prior to encounter.    No current outpatient medications on file prior to encounter.    FAMILY HISTORY:  His has no family status information on file.    SOCIAL HISTORY: He    REVIEW OF SYSTEMS:   Not obtainable SUBJECTIVE:  As above  VITAL SIGNS: BP 133/70   Pulse 65   Temp 100.2 F (37.9 C) (Axillary)   Resp 16   Wt 142 lb 10.2 oz (64.7 kg)   SpO2 98%   HEMODYNAMICS:    VENTILATOR SETTINGS: Vent Mode: PRVC FiO2 (%):  [40 %-70 %] 40 % Set Rate:  [16 bmp] 16 bmp Vt Set:  [500 mL] 500 mL PEEP:  [5 cmH20] 5 cmH20 Plateau Pressure:  [16 cmH20-17 cmH20] 17 cmH20  INTAKE / OUTPUT: I/O last 3 completed shifts: In: 2364.3 [I.V.:2158.3; Blood:206] Out: 1040 [Urine:790; Emesis/NG output:150; Blood:100]  PHYSICAL EXAMINATION: General: This is an elderly male who is orally intubated and mechanically ventilated Neuro: There is no response to voice, loud noise, or sternal rub.  Pupils are fixed at 7 mm.  EOMs are absent by doll's eyes maneuver.  Cough and gag are absent.  He does not breathe above the set ventilator rate. Cardiovascular: S1 and S2 are regular without murmur rub or gallop. Lungs: He is orally intubated, there  is symmetric air movement, few scattered rhonchi no wheezes. Abdomen: The abdomen is soft without any organomegaly or masses   BMET Recent Labs  Lab 06/22/2017 1218 June 22, 2017 1234 05/29/2017 0424  NA 136 137 135  K 4.7 4.7 4.3  CL 100* 100* 106  CO2 26  --  18*  BUN 13 17 16   CREATININE 1.63* 1.60* 1.67*  GLUCOSE 159* 154* 202*    Electrolytes Recent Labs  Lab 2017/06/22 1218 05/24/2017 0424  CALCIUM 9.5 8.0*    CBC Recent Labs  Lab Jun 22, 2017 1218 06-22-17 1234 05/30/2017 0424  WBC 7.4   --  7.5  HGB 16.4 17.7* 13.0  HCT 49.0 52.0 39.6  PLT 151  --  155    Coag's Recent Labs  Lab 2017/06/22 1218  APTT 37*  INR 1.06    Sepsis Markers No results for input(s): LATICACIDVEN, PROCALCITON, O2SATVEN in the last 168 hours.  ABG Recent Labs  Lab June 22, 2017 2110 June 22, 2017 2224  PHART 7.321* 7.265*  PCO2ART 30.2* 47.8  PO2ART 159.0* 221.0*    Liver Enzymes Recent Labs  Lab 06-22-17 1218  AST 21  ALT 17  ALKPHOS 40  BILITOT 1.3*  ALBUMIN 3.6    Cardiac Enzymes No results for input(s): TROPONINI, PROBNP in the last 168 hours.  Glucose Recent Labs  Lab 22-Jun-2017 2350 05/18/2017 0401 05/21/2017 0736  GLUCAP 261* 192* 179*    Imaging Ct Angio Head W Or Wo Contrast  Result Date: 22-Jun-2017 CLINICAL DATA:  Left-sided weakness while at physical therapy today. EXAM: CT HEAD WITHOUT CONTRAST CT ANGIOGRAPHY OF THE HEAD AND NECK TECHNIQUE: Contiguous axial images were obtained from the base of the skull through the vertex without intravenous contrast. Multidetector CT imaging of the head and neck was performed using the standard protocol during bolus administration of intravenous contrast. Multiplanar CT image reconstructions and MIPs were obtained to evaluate the vascular anatomy. Carotid stenosis measurements (when applicable) are obtained utilizing NASCET criteria, using the distal internal carotid diameter as the denominator. CONTRAST:  50mL ISOVUE-370 IOPAMIDOL (ISOVUE-370) INJECTION 76% COMPARISON:  None. FINDINGS: CT HEAD Brain: Generalized atrophy. Chronic small-vessel ischemic changes of the cerebral hemispheric white matter. No sign of acute infarction, mass lesion, hemorrhage, hydrocephalus or extra-axial collection. Vascular: There is atherosclerotic calcification of the major vessels at the base of the brain. Skull: Normal Sinuses/Orbits: Negative CTA NECK Aortic arch: Aortic atherosclerosis.  No aneurysm or dissection. Right carotid system: Common carotid artery  widely patent to the bifurcation. Carotid bifurcation is normal without soft or calcified plaque. Cervical internal carotid artery is tortuous but widely patent. Left carotid system: Common carotid artery widely patent to the bifurcation. Carotid bifurcation is normal without soft or calcified plaque. Cervical ICA is tortuous but otherwise normal. Vertebral arteries:Right vertebral artery is dominant. The origin is widely patent. The vessel is widely patent through the cervical region. Left vertebral artery is occluded at its origin but shows some reconstitution by cervical collaterals. Skeleton: Ordinary mid cervical spondylosis. Other neck: No mass or lymphadenopathy. Upper lobes are clear. There is either stenosis or partial thrombosis the left subclavian/innominate vein. CTA HEAD Anterior circulation: Both internal carotid arteries are patent through the skull base and siphon regions. There is ordinary peripheral atherosclerotic calcification in the carotid siphon regions. No stenosis greater than 30% is suspected. The anterior and middle cerebral vessels are approximately patent without proximal stenosis, aneurysm or vascular malformation. There are missing/hypoperfusion in M2 and M3 regions on the right suggesting embolic disease in the right  MCA territory. Posterior circulation: Right vertebral artery is dominant, supplying the basilar. Left vertebral artery is a small vessel that terminates in PICA. Basilar artery shows multiple serial stenoses. Flow is present within the superior cerebellar and left posterior cerebral artery from the posterior circulation. Right PCA receives it supply from the anterior circulation. Venous sinuses: Patent and normal. Anatomic variants: None significant. Delayed phase: No abnormal enhancement. IMPRESSION: Head CT does not show any acute ischemic change at this moment. However, there appears to be embolic disease to the right MCA territory in the M2 to M3 vessels. Consider CT  perfusion study. Left vertebral artery occluded proximally with reconstitution of a small vessel in the upper cervical region. Serial basilar artery stenoses. These results were called by telephone at the time of interpretation on 05/30/2017 at 2:27 pm to Dr. Meridee Score , who verbally acknowledged these results. Electronically Signed   By: Paulina Fusi M.D.   On: 05/28/2017 14:30   Ct Head Wo Contrast  Result Date: 05/14/2017 CLINICAL DATA:  83 y/o  M; post IR exam. EXAM: CT HEAD WITHOUT CONTRAST TECHNIQUE: Contiguous axial images were obtained from the base of the skull through the vertex without intravenous contrast. COMPARISON:  05/27/2017 MRI head. 05/14/2017 CT head and CTA head. 06/01/2017 cerebral angiogram. FINDINGS: Brain: Decreased high attenuation material throughout subarachnoid and intraventricular spaces concentrated basilar and posterior fossa cisterns. Extra-axial material in right MCA station measures 73 HU, likely hemorrhage. Elsewhere extra-axial material measures greater than 100 HU, likely contrast and possibly underlying hemorrhage. Interval dilatation of the lateral and third ventricles compatible developing hydrocephalus, third ventricle measures 11 mm medial-lateral, previously 9 mm. Small foci of hypoattenuation in right temporal lobe corresponding to infarcts on MR. No large brain parenchymal hemorrhage identified. Vascular: Obscured by extra-axial material. Skull: Normal. Negative for fracture or focal lesion. Sinuses/Orbits: Mild diffuse paranasal sinus mucosal thickening. Partial opacification of left mastoid air cells. Orbits are unremarkable. Other: None. IMPRESSION: 1. High attenuation material throughout subarachnoid and intraventricular spaces. In right MCA cistern there is likely hemorrhage. Elsewhere material may represent contrast and/or hemorrhage. 2. Interval development of mild hydrocephalus.  No herniation. 3. Small foci of infarction in right temporal lobe as seen on  MRI. No brain parenchymal hemorrhage identified. These results were called by telephone at the time of interpretation on 06/08/2017 at 8:07 pm to Dr. Amada Jupiter, who verbally acknowledged these results. Electronically Signed   By: Mitzi Hansen M.D.   On: 05/21/2017 20:08   Ct Head Wo Contrast  Result Date: 06/03/2017 CLINICAL DATA:  Left-sided weakness while at physical therapy today. EXAM: CT HEAD WITHOUT CONTRAST CT ANGIOGRAPHY OF THE HEAD AND NECK TECHNIQUE: Contiguous axial images were obtained from the base of the skull through the vertex without intravenous contrast. Multidetector CT imaging of the head and neck was performed using the standard protocol during bolus administration of intravenous contrast. Multiplanar CT image reconstructions and MIPs were obtained to evaluate the vascular anatomy. Carotid stenosis measurements (when applicable) are obtained utilizing NASCET criteria, using the distal internal carotid diameter as the denominator. CONTRAST:  50mL ISOVUE-370 IOPAMIDOL (ISOVUE-370) INJECTION 76% COMPARISON:  None. FINDINGS: CT HEAD Brain: Generalized atrophy. Chronic small-vessel ischemic changes of the cerebral hemispheric white matter. No sign of acute infarction, mass lesion, hemorrhage, hydrocephalus or extra-axial collection. Vascular: There is atherosclerotic calcification of the major vessels at the base of the brain. Skull: Normal Sinuses/Orbits: Negative CTA NECK Aortic arch: Aortic atherosclerosis.  No aneurysm or dissection. Right carotid system: Common  carotid artery widely patent to the bifurcation. Carotid bifurcation is normal without soft or calcified plaque. Cervical internal carotid artery is tortuous but widely patent. Left carotid system: Common carotid artery widely patent to the bifurcation. Carotid bifurcation is normal without soft or calcified plaque. Cervical ICA is tortuous but otherwise normal. Vertebral arteries:Right vertebral artery is dominant. The  origin is widely patent. The vessel is widely patent through the cervical region. Left vertebral artery is occluded at its origin but shows some reconstitution by cervical collaterals. Skeleton: Ordinary mid cervical spondylosis. Other neck: No mass or lymphadenopathy. Upper lobes are clear. There is either stenosis or partial thrombosis the left subclavian/innominate vein. CTA HEAD Anterior circulation: Both internal carotid arteries are patent through the skull base and siphon regions. There is ordinary peripheral atherosclerotic calcification in the carotid siphon regions. No stenosis greater than 30% is suspected. The anterior and middle cerebral vessels are approximately patent without proximal stenosis, aneurysm or vascular malformation. There are missing/hypoperfusion in M2 and M3 regions on the right suggesting embolic disease in the right MCA territory. Posterior circulation: Right vertebral artery is dominant, supplying the basilar. Left vertebral artery is a small vessel that terminates in PICA. Basilar artery shows multiple serial stenoses. Flow is present within the superior cerebellar and left posterior cerebral artery from the posterior circulation. Right PCA receives it supply from the anterior circulation. Venous sinuses: Patent and normal. Anatomic variants: None significant. Delayed phase: No abnormal enhancement. IMPRESSION: Head CT does not show any acute ischemic change at this moment. However, there appears to be embolic disease to the right MCA territory in the M2 to M3 vessels. Consider CT perfusion study. Left vertebral artery occluded proximally with reconstitution of a small vessel in the upper cervical region. Serial basilar artery stenoses. These results were called by telephone at the time of interpretation on 05/22/2017 at 2:27 pm to Dr. Meridee ScoreMICHAEL BUTLER , who verbally acknowledged these results. Electronically Signed   By: Paulina FusiMark  Shogry M.D.   On: 05/29/2017 14:30   Ct Angio Neck W Or  Wo Contrast  Result Date: 06/02/2017 CLINICAL DATA:  Left-sided weakness while at physical therapy today. EXAM: CT HEAD WITHOUT CONTRAST CT ANGIOGRAPHY OF THE HEAD AND NECK TECHNIQUE: Contiguous axial images were obtained from the base of the skull through the vertex without intravenous contrast. Multidetector CT imaging of the head and neck was performed using the standard protocol during bolus administration of intravenous contrast. Multiplanar CT image reconstructions and MIPs were obtained to evaluate the vascular anatomy. Carotid stenosis measurements (when applicable) are obtained utilizing NASCET criteria, using the distal internal carotid diameter as the denominator. CONTRAST:  50mL ISOVUE-370 IOPAMIDOL (ISOVUE-370) INJECTION 76% COMPARISON:  None. FINDINGS: CT HEAD Brain: Generalized atrophy. Chronic small-vessel ischemic changes of the cerebral hemispheric white matter. No sign of acute infarction, mass lesion, hemorrhage, hydrocephalus or extra-axial collection. Vascular: There is atherosclerotic calcification of the major vessels at the base of the brain. Skull: Normal Sinuses/Orbits: Negative CTA NECK Aortic arch: Aortic atherosclerosis.  No aneurysm or dissection. Right carotid system: Common carotid artery widely patent to the bifurcation. Carotid bifurcation is normal without soft or calcified plaque. Cervical internal carotid artery is tortuous but widely patent. Left carotid system: Common carotid artery widely patent to the bifurcation. Carotid bifurcation is normal without soft or calcified plaque. Cervical ICA is tortuous but otherwise normal. Vertebral arteries:Right vertebral artery is dominant. The origin is widely patent. The vessel is widely patent through the cervical region. Left vertebral artery is  occluded at its origin but shows some reconstitution by cervical collaterals. Skeleton: Ordinary mid cervical spondylosis. Other neck: No mass or lymphadenopathy. Upper lobes are clear.  There is either stenosis or partial thrombosis the left subclavian/innominate vein. CTA HEAD Anterior circulation: Both internal carotid arteries are patent through the skull base and siphon regions. There is ordinary peripheral atherosclerotic calcification in the carotid siphon regions. No stenosis greater than 30% is suspected. The anterior and middle cerebral vessels are approximately patent without proximal stenosis, aneurysm or vascular malformation. There are missing/hypoperfusion in M2 and M3 regions on the right suggesting embolic disease in the right MCA territory. Posterior circulation: Right vertebral artery is dominant, supplying the basilar. Left vertebral artery is a small vessel that terminates in PICA. Basilar artery shows multiple serial stenoses. Flow is present within the superior cerebellar and left posterior cerebral artery from the posterior circulation. Right PCA receives it supply from the anterior circulation. Venous sinuses: Patent and normal. Anatomic variants: None significant. Delayed phase: No abnormal enhancement. IMPRESSION: Head CT does not show any acute ischemic change at this moment. However, there appears to be embolic disease to the right MCA territory in the M2 to M3 vessels. Consider CT perfusion study. Left vertebral artery occluded proximally with reconstitution of a small vessel in the upper cervical region. Serial basilar artery stenoses. These results were called by telephone at the time of interpretation on 06/27/2017 at 2:27 pm to Dr. Meridee Score , who verbally acknowledged these results. Electronically Signed   By: Paulina Fusi M.D.   On: 06/27/2017 14:30   Mr Brain Wo Contrast  Result Date: 2017-06-27 CLINICAL DATA:  Code stroke follow-up. The patient was sent directly to interventional radiology following examination. EXAM: MRI HEAD WITHOUT CONTRAST TECHNIQUE: Multiplanar, multiecho pulse sequences of the brain and surrounding structures were obtained without  intravenous contrast. COMPARISON:  Head CT and CTA/perfusion Jun 27, 2017 FINDINGS: Axial diffusion weighted imaging and axial T2-weighted FLAIR imaging were performed. There is multifocal abnormal diffusion restriction within the posterior right MCA territory, much less than the volume of penumbra identified on the perfusion study. Multifocal periventricular white matter hyperintensity, most often a result of chronic microvascular ischemia. No acute hemorrhage or mass effect. IMPRESSION: Multiple areas of ischemic infarct within the posterior right MCA territory, but much smaller than the volume of penumbra identified on the perfusion study. Electronically Signed   By: Deatra Robinson M.D.   On: 27-Jun-2017 16:08   Ct Cerebral Perfusion W Contrast  Result Date: 27-Jun-2017 CLINICAL DATA:  83 year old male with right MCA M2/M3 thromboembolic disease suspected on CTA head and neck today performed for stroke-like symptoms. CT perfusion requested to evaluate treatment options. EXAM: CT PERFUSION BRAIN TECHNIQUE: Multiphase CT imaging of the brain was performed following IV bolus contrast injection. Subsequent parametric perfusion maps were calculated using RAPID software. CONTRAST:  40mL ISOVUE-370 IOPAMIDOL (ISOVUE-370) INJECTION 76% COMPARISON:  CTA head and neck 1358 hr today. FINDINGS: CT Brain Perfusion Findings: CBF (<30%) Volume: None. Perfusion (Tmax>6.0s) volume: 106  mL Mismatch Volume: 106 mL Mismatch ratio: Infant. Infarction Location:No core infarct, but right MCA territory penumbra. Other findings: CT perfusion source images demonstrate the same pattern of abnormal right MCA branches as on the earlier CTA. There seems to be a short right M1 segment, early bifurcation. It is unclear whether or not there is focal thrombus at the right MCA bifurcation. IMPRESSION: 1. No core infarct detected by CT perfusion, which seems valid in light of normal ASPECTS thus far. 2. Right  MCA territory penumbra estimated at 106  mL. 3. This study was reviewed in person with Dr. Milon Dikes on 05/16/2017 at 1540 hrs. Electronically Signed   By: Odessa Fleming M.D.   On: 06/02/2017 15:56   Dg Chest Port 1 View  Result Date: 05/21/2017 CLINICAL DATA:  Ventilator dependent EXAM: PORTABLE CHEST 1 VIEW COMPARISON:  None. FINDINGS: Endotracheal tube tip is about 3.8 cm superior to the carina. Esophageal tube tip is below the diaphragm but is non included. Minimal atelectasis at the bases. No pleural effusion. Normal heart size. Prominent aortic knob measuring up to 4.1 cm. No pneumothorax. IMPRESSION: 1.  Et tube tip 3.8 cm above carina. 2.  Mild bibasilar atelectasis. Electronically Signed   By: Jasmine Pang M.D.   On: 05/18/2017 21:09     DISCUSSION:      83 year old hypertensive diabetic with Alzheimer's disease presenting with left hemiplegia.  He was taken to IR for a right MCA occlusion and subsequently developed a diffuse subarachnoid hemorrhage with acute hydrocephalus.  He is showing no evidence of cortical or brainstem activity on my current examination which was performed with the patient normothermic without receiving any sedation since 1700 on 2/22.  Family is considering whether or not they wish me to perform a formal brain death declaration or simply withdraw supportive measures with the understanding that he has a neurological insult from which he will not recover.  Greater than 32 minutes was spent in the care of this patient today who is suffered from what is likely a lethal CNS event and who is dependent on ventilator support.    Penny Pia, MD Pulmonary and Critical Care Medicine [facility] Pager: [phone]  2017-06-05, 10:44 AM

## 2017-06-10 NOTE — Progress Notes (Signed)
I discussed the situation with Mr. Adam Hull's extended family, initially through a [place] interpreter..  They expressed their understanding that he had suffered from a stroke so severe that he would not wake up again and that had destroyed even basic function such as breathing.  They were aware that he would not likely even breathe on his own if the ventilator were removed.  All concurred that he would not wish to be supported in a severely neurologically impaired state, the distinction between severely impaired and brain dead being unimportant.  At family request and after clergy arrived the patient was extubated.  He made no spontaneous respiratory efforts and was pronounced at 1422.

## 2017-06-10 NOTE — Progress Notes (Signed)
Nutrition Brief Note  Patient identified as newly ventilated patient.   Patient was close to meeting brain death criteria yesterday evening. Repeat apnea test today. Prognosis extremely poor. No anticipated need for nutritional support.   If condition/situation changes and enteral nutrition desired or nutrition issues arise, please consult RD.   Adam LouisNathan Anique Hull RD, LDN, CNSC Clinical Nutrition Pager: [phone] 02/04/2018 10:00 AM

## 2017-06-10 NOTE — Death Summary Note (Signed)
Stroke Discharge Summary  Patient ID: Adam Hull   MRN: [id number]      DOB: 05/14/1934  Date of Admission: 05/31/2017 Date of Discharge: 2017/06/13  Attending Physician:  Rosalin Hawking, MD, Stroke MD Consultant(s):   Treatment Team:  Pccm, Md, MD interventional radiology  Patient's PCP:  Linda Hedges, NP  DISCHARGE DIAGNOSIS:  Active Problems:   Brain herniation   Brain death   Right MCA infarct   Middle cerebral artery embolism, right   SAH   Dementia    HLD   HTN   DM   Past Medical History:  Diagnosis Date  . Alzheimer's dementia without behavioral disturbance   . Arthritis   . Diabetes mellitus without complication ([place])   . Hyperlipidemia   . Hypertension   . Vitamin D deficiency    History reviewed. No pertinent surgical history.    LABORATORY STUDIES CBC    Component Value Date/Time   WBC 7.5 2017/06/13 0424   RBC 4.64 13-Jun-2017 0424   HGB 13.0 Jun 13, 2017 0424   HCT 39.6 2017/06/13 0424   PLT 155 June 13, 2017 0424   MCV 85.3 13-Jun-2017 0424   MCH 28.0 06-13-2017 0424   MCHC 32.8 June 13, 2017 0424   RDW 13.8 2017/06/13 0424   LYMPHSABS 0.8 13-Jun-2017 0424   MONOABS 0.3 June 13, 2017 0424   EOSABS 0.0 Jun 13, 2017 0424   BASOSABS 0.0 06-13-17 0424   CMP    Component Value Date/Time   NA 135 Jun 13, 2017 0424   K 4.3 13-Jun-2017 0424   CL 106 Jun 13, 2017 0424   CO2 18 (L) 06/13/17 0424   GLUCOSE 202 (H) 2017-06-13 0424   BUN 16 2017/06/13 0424   CREATININE 1.67 (H) 06/13/2017 0424   CALCIUM 8.0 (L) 2017-06-13 0424   PROT 7.4 05/19/2017 1218   ALBUMIN 3.6 05/23/2017 1218   AST 21 05/30/2017 1218   ALT 17 05/23/2017 1218   ALKPHOS 40 05/14/2017 1218   BILITOT 1.3 (H) 06/08/2017 1218   GFRNONAA 37 (L) June 13, 2017 0424   GFRAA 43 (L) June 13, 2017 0424   COAGS Lab Results  Component Value Date   INR 1.06 06/09/2017   Lipid Panel    Component Value Date/Time   CHOL 153 06/13/17 0424   TRIG 66 June 13, 2017 0424   HDL 42 06/13/17 0424    CHOLHDL 3.6 June 13, 2017 0424   VLDL 13 2017/06/13 0424   LDLCALC 98 2017-06-13 0424   HgbA1C No results found for: HGBA1C Urinalysis No results found for: COLORURINE, APPEARANCEUR, LABSPEC, PHURINE, GLUCOSEU, HGBUR, BILIRUBINUR, KETONESUR, PROTEINUR, UROBILINOGEN, NITRITE, LEUKOCYTESUR Urine Drug Screen No results found for: LABOPIA, COCAINSCRNUR, LABBENZ, AMPHETMU, THCU, LABBARB  Alcohol Level    Component Value Date/Time   ETH <10 05/28/2017 1217     SIGNIFICANT DIAGNOSTIC STUDIES Ct Angio Head W Or Wo Contrast  Result Date: 05/14/2017 CLINICAL DATA:  Left-sided weakness while at physical therapy today. EXAM: CT HEAD WITHOUT CONTRAST CT ANGIOGRAPHY OF THE HEAD AND NECK TECHNIQUE: Contiguous axial images were obtained from the base of the skull through the vertex without intravenous contrast. Multidetector CT imaging of the head and neck was performed using the standard protocol during bolus administration of intravenous contrast. Multiplanar CT image reconstructions and MIPs were obtained to evaluate the vascular anatomy. Carotid stenosis measurements (when applicable) are obtained utilizing NASCET criteria, using the distal internal carotid diameter as the denominator. CONTRAST:  97m ISOVUE-370 IOPAMIDOL (ISOVUE-370) INJECTION 76% COMPARISON:  None. FINDINGS: CT HEAD Brain: Generalized atrophy. Chronic small-vessel ischemic changes of the  cerebral hemispheric white matter. No sign of acute infarction, mass lesion, hemorrhage, hydrocephalus or extra-axial collection. Vascular: There is atherosclerotic calcification of the major vessels at the base of the brain. Skull: Normal Sinuses/Orbits: Negative CTA NECK Aortic arch: Aortic atherosclerosis.  No aneurysm or dissection. Right carotid system: Common carotid artery widely patent to the bifurcation. Carotid bifurcation is normal without soft or calcified plaque. Cervical internal carotid artery is tortuous but widely patent. Left carotid system:  Common carotid artery widely patent to the bifurcation. Carotid bifurcation is normal without soft or calcified plaque. Cervical ICA is tortuous but otherwise normal. Vertebral arteries:Right vertebral artery is dominant. The origin is widely patent. The vessel is widely patent through the cervical region. Left vertebral artery is occluded at its origin but shows some reconstitution by cervical collaterals. Skeleton: Ordinary mid cervical spondylosis. Other neck: No mass or lymphadenopathy. Upper lobes are clear. There is either stenosis or partial thrombosis the left subclavian/innominate vein. CTA HEAD Anterior circulation: Both internal carotid arteries are patent through the skull base and siphon regions. There is ordinary peripheral atherosclerotic calcification in the carotid siphon regions. No stenosis greater than 30% is suspected. The anterior and middle cerebral vessels are approximately patent without proximal stenosis, aneurysm or vascular malformation. There are missing/hypoperfusion in M2 and M3 regions on the right suggesting embolic disease in the right MCA territory. Posterior circulation: Right vertebral artery is dominant, supplying the basilar. Left vertebral artery is a small vessel that terminates in PICA. Basilar artery shows multiple serial stenoses. Flow is present within the superior cerebellar and left posterior cerebral artery from the posterior circulation. Right PCA receives it supply from the anterior circulation. Venous sinuses: Patent and normal. Anatomic variants: None significant. Delayed phase: No abnormal enhancement. IMPRESSION: Head CT does not show any acute ischemic change at this moment. However, there appears to be embolic disease to the right MCA territory in the M2 to M3 vessels. Consider CT perfusion study. Left vertebral artery occluded proximally with reconstitution of a small vessel in the upper cervical region. Serial basilar artery stenoses. These results were  called by telephone at the time of interpretation on 06/07/2017 at 2:27 pm to Dr. Aletta Edouard , who verbally acknowledged these results. Electronically Signed   By: Nelson Chimes M.D.   On: 05/13/2017 14:30   Ct Head Wo Contrast  Result Date: 06/05/2017 CLINICAL DATA:  83 y/o  M; post IR exam. EXAM: CT HEAD WITHOUT CONTRAST TECHNIQUE: Contiguous axial images were obtained from the base of the skull through the vertex without intravenous contrast. COMPARISON:  05/16/2017 MRI head. 06/07/2017 CT head and CTA head. 05/19/2017 cerebral angiogram. FINDINGS: Brain: Decreased high attenuation material throughout subarachnoid and intraventricular spaces concentrated basilar and posterior fossa cisterns. Extra-axial material in right MCA station measures 73 HU, likely hemorrhage. Elsewhere extra-axial material measures greater than 100 HU, likely contrast and possibly underlying hemorrhage. Interval dilatation of the lateral and third ventricles compatible developing hydrocephalus, third ventricle measures 11 mm medial-lateral, previously 9 mm. Small foci of hypoattenuation in right temporal lobe corresponding to infarcts on MR. No large brain parenchymal hemorrhage identified. Vascular: Obscured by extra-axial material. Skull: Normal. Negative for fracture or focal lesion. Sinuses/Orbits: Mild diffuse paranasal sinus mucosal thickening. Partial opacification of left mastoid air cells. Orbits are unremarkable. Other: None. IMPRESSION: 1. High attenuation material throughout subarachnoid and intraventricular spaces. In right MCA cistern there is likely hemorrhage. Elsewhere material may represent contrast and/or hemorrhage. 2. Interval development of mild hydrocephalus.  No herniation.  3. Small foci of infarction in right temporal lobe as seen on MRI. No brain parenchymal hemorrhage identified. These results were called by telephone at the time of interpretation on 05/28/2017 at 8:07 pm to Dr. Leonel Ramsay, who verbally  acknowledged these results. Electronically Signed   By: Kristine Garbe M.D.   On: 05/30/2017 20:08   Ct Head Wo Contrast  Result Date: 06/02/2017 CLINICAL DATA:  Left-sided weakness while at physical therapy today. EXAM: CT HEAD WITHOUT CONTRAST CT ANGIOGRAPHY OF THE HEAD AND NECK TECHNIQUE: Contiguous axial images were obtained from the base of the skull through the vertex without intravenous contrast. Multidetector CT imaging of the head and neck was performed using the standard protocol during bolus administration of intravenous contrast. Multiplanar CT image reconstructions and MIPs were obtained to evaluate the vascular anatomy. Carotid stenosis measurements (when applicable) are obtained utilizing NASCET criteria, using the distal internal carotid diameter as the denominator. CONTRAST:  20m ISOVUE-370 IOPAMIDOL (ISOVUE-370) INJECTION 76% COMPARISON:  None. FINDINGS: CT HEAD Brain: Generalized atrophy. Chronic small-vessel ischemic changes of the cerebral hemispheric white matter. No sign of acute infarction, mass lesion, hemorrhage, hydrocephalus or extra-axial collection. Vascular: There is atherosclerotic calcification of the major vessels at the base of the brain. Skull: Normal Sinuses/Orbits: Negative CTA NECK Aortic arch: Aortic atherosclerosis.  No aneurysm or dissection. Right carotid system: Common carotid artery widely patent to the bifurcation. Carotid bifurcation is normal without soft or calcified plaque. Cervical internal carotid artery is tortuous but widely patent. Left carotid system: Common carotid artery widely patent to the bifurcation. Carotid bifurcation is normal without soft or calcified plaque. Cervical ICA is tortuous but otherwise normal. Vertebral arteries:Right vertebral artery is dominant. The origin is widely patent. The vessel is widely patent through the cervical region. Left vertebral artery is occluded at its origin but shows some reconstitution by cervical  collaterals. Skeleton: Ordinary mid cervical spondylosis. Other neck: No mass or lymphadenopathy. Upper lobes are clear. There is either stenosis or partial thrombosis the left subclavian/innominate vein. CTA HEAD Anterior circulation: Both internal carotid arteries are patent through the skull base and siphon regions. There is ordinary peripheral atherosclerotic calcification in the carotid siphon regions. No stenosis greater than 30% is suspected. The anterior and middle cerebral vessels are approximately patent without proximal stenosis, aneurysm or vascular malformation. There are missing/hypoperfusion in M2 and M3 regions on the right suggesting embolic disease in the right MCA territory. Posterior circulation: Right vertebral artery is dominant, supplying the basilar. Left vertebral artery is a small vessel that terminates in PICA. Basilar artery shows multiple serial stenoses. Flow is present within the superior cerebellar and left posterior cerebral artery from the posterior circulation. Right PCA receives it supply from the anterior circulation. Venous sinuses: Patent and normal. Anatomic variants: None significant. Delayed phase: No abnormal enhancement. IMPRESSION: Head CT does not show any acute ischemic change at this moment. However, there appears to be embolic disease to the right MCA territory in the M2 to M3 vessels. Consider CT perfusion study. Left vertebral artery occluded proximally with reconstitution of a small vessel in the upper cervical region. Serial basilar artery stenoses. These results were called by telephone at the time of interpretation on 05/18/2017 at 2:27 pm to Dr. MAletta Edouard, who verbally acknowledged these results. Electronically Signed   By: MNelson ChimesM.D.   On: 05/15/2017 14:30   Ct Angio Neck W Or Wo Contrast  Result Date: 05/30/2017 CLINICAL DATA:  Left-sided weakness while at physical therapy today.  EXAM: CT HEAD WITHOUT CONTRAST CT ANGIOGRAPHY OF THE HEAD AND  NECK TECHNIQUE: Contiguous axial images were obtained from the base of the skull through the vertex without intravenous contrast. Multidetector CT imaging of the head and neck was performed using the standard protocol during bolus administration of intravenous contrast. Multiplanar CT image reconstructions and MIPs were obtained to evaluate the vascular anatomy. Carotid stenosis measurements (when applicable) are obtained utilizing NASCET criteria, using the distal internal carotid diameter as the denominator. CONTRAST:  52m ISOVUE-370 IOPAMIDOL (ISOVUE-370) INJECTION 76% COMPARISON:  None. FINDINGS: CT HEAD Brain: Generalized atrophy. Chronic small-vessel ischemic changes of the cerebral hemispheric white matter. No sign of acute infarction, mass lesion, hemorrhage, hydrocephalus or extra-axial collection. Vascular: There is atherosclerotic calcification of the major vessels at the base of the brain. Skull: Normal Sinuses/Orbits: Negative CTA NECK Aortic arch: Aortic atherosclerosis.  No aneurysm or dissection. Right carotid system: Common carotid artery widely patent to the bifurcation. Carotid bifurcation is normal without soft or calcified plaque. Cervical internal carotid artery is tortuous but widely patent. Left carotid system: Common carotid artery widely patent to the bifurcation. Carotid bifurcation is normal without soft or calcified plaque. Cervical ICA is tortuous but otherwise normal. Vertebral arteries:Right vertebral artery is dominant. The origin is widely patent. The vessel is widely patent through the cervical region. Left vertebral artery is occluded at its origin but shows some reconstitution by cervical collaterals. Skeleton: Ordinary mid cervical spondylosis. Other neck: No mass or lymphadenopathy. Upper lobes are clear. There is either stenosis or partial thrombosis the left subclavian/innominate vein. CTA HEAD Anterior circulation: Both internal carotid arteries are patent through the skull  base and siphon regions. There is ordinary peripheral atherosclerotic calcification in the carotid siphon regions. No stenosis greater than 30% is suspected. The anterior and middle cerebral vessels are approximately patent without proximal stenosis, aneurysm or vascular malformation. There are missing/hypoperfusion in M2 and M3 regions on the right suggesting embolic disease in the right MCA territory. Posterior circulation: Right vertebral artery is dominant, supplying the basilar. Left vertebral artery is a small vessel that terminates in PICA. Basilar artery shows multiple serial stenoses. Flow is present within the superior cerebellar and left posterior cerebral artery from the posterior circulation. Right PCA receives it supply from the anterior circulation. Venous sinuses: Patent and normal. Anatomic variants: None significant. Delayed phase: No abnormal enhancement. IMPRESSION: Head CT does not show any acute ischemic change at this moment. However, there appears to be embolic disease to the right MCA territory in the M2 to M3 vessels. Consider CT perfusion study. Left vertebral artery occluded proximally with reconstitution of a small vessel in the upper cervical region. Serial basilar artery stenoses. These results were called by telephone at the time of interpretation on 05/13/2017 at 2:27 pm to Dr. MAletta Edouard, who verbally acknowledged these results. Electronically Signed   By: MNelson ChimesM.D.   On: 05/13/2017 14:30   Mr Brain Wo Contrast  Result Date: 05/24/2017 CLINICAL DATA:  Code stroke follow-up. The patient was sent directly to interventional radiology following examination. EXAM: MRI HEAD WITHOUT CONTRAST TECHNIQUE: Multiplanar, multiecho pulse sequences of the brain and surrounding structures were obtained without intravenous contrast. COMPARISON:  Head CT and CTA/perfusion 05/17/2017 FINDINGS: Axial diffusion weighted imaging and axial T2-weighted FLAIR imaging were performed. There  is multifocal abnormal diffusion restriction within the posterior right MCA territory, much less than the volume of penumbra identified on the perfusion study. Multifocal periventricular white matter hyperintensity, most often a  result of chronic microvascular ischemia. No acute hemorrhage or mass effect. IMPRESSION: Multiple areas of ischemic infarct within the posterior right MCA territory, but much smaller than the volume of penumbra identified on the perfusion study. Electronically Signed   By: Ulyses Jarred M.D.   On: 05/23/2017 16:08   Ct Cerebral Perfusion W Contrast  Result Date: 05/21/2017 CLINICAL DATA:  83 year old male with right MCA O7/F6 thromboembolic disease suspected on CTA head and neck today performed for stroke-like symptoms. CT perfusion requested to evaluate treatment options. EXAM: CT PERFUSION BRAIN TECHNIQUE: Multiphase CT imaging of the brain was performed following IV bolus contrast injection. Subsequent parametric perfusion maps were calculated using RAPID software. CONTRAST:  4m ISOVUE-370 IOPAMIDOL (ISOVUE-370) INJECTION 76% COMPARISON:  CTA head and neck 1358 hr today. FINDINGS: CT Brain Perfusion Findings: CBF (<30%) Volume: None. Perfusion (Tmax>6.0s) volume: 106  mL Mismatch Volume: 106 mL Mismatch ratio: Infant. Infarction Location:No core infarct, but right MCA territory penumbra. Other findings: CT perfusion source images demonstrate the same pattern of abnormal right MCA branches as on the earlier CTA. There seems to be a short right M1 segment, early bifurcation. It is unclear whether or not there is focal thrombus at the right MCA bifurcation. IMPRESSION: 1. No core infarct detected by CT perfusion, which seems valid in light of normal ASPECTS thus far. 2. Right MCA territory penumbra estimated at 106 mL. 3. This study was reviewed in person with Dr. AAmie Portlandon 05/27/2017 at 1540 hrs. Electronically Signed   By: HGenevie AnnM.D.   On: 05/29/2017 15:56   Dg Chest  Port 1 View  Result Date: 05/15/2017 CLINICAL DATA:  Ventilator dependent EXAM: PORTABLE CHEST 1 VIEW COMPARISON:  None. FINDINGS: Endotracheal tube tip is about 3.8 cm superior to the carina. Esophageal tube tip is below the diaphragm but is non included. Minimal atelectasis at the bases. No pleural effusion. Normal heart size. Prominent aortic knob measuring up to 4.1 cm. No pneumothorax. IMPRESSION: 1.  Et tube tip 3.8 cm above carina. 2.  Mild bibasilar atelectasis. Electronically Signed   By: KDonavan FoilM.D.   On: 05/30/2017 21:09     TTE - Left ventricle: The cavity size was normal. Systolic function was normal. The estimated ejection fraction was in the range of 55% to 60%. Wall motion was normal; there were no regional wall motion abnormalities. Doppler parameters are consistent with abnormal left ventricular relaxation (grade 1 diastolic dysfunction). Doppler parameters are consistent with high ventricular filling pressure. Mild focal basal septal hypertrophy. - Aortic valve: Mildly calcified annulus. Trileaflet. There was mild regurgitation. - Mitral valve: Mildly calcified annulus. - Left atrium: The atrium was mildly dilated. - Atrial septum: No defect or patent foramen ovale was identified.     HISTORY OF PRESENT ILLNESS  Ulus Koslosky is an 83y.o. male who has dementia, DM2, HTN, HLD. He was sent to the ER via EMS by his care team at his day care center for left side weakness. Day center reports that he arrived disheveled, putting a shirt on like underwear and acting odd. The left side weakness completely resolve by the time the EMS team arrived, thus no code stroke called. He is not able to provide much information, thus it is from his care-givers and staff. Normally he can speak good [place]anu[place] but today he was speaking only Vetmenese. Interpretor is at bedside. As part of his work up in the ER he had a CT/CTA. This showed a Rt M 2-3 acute  occlusion, with no  early ischemic changes seen. This was emergently called to the ER at 1358. CODE STROKE was then activated. Upon emergent neuro exam, he had left side neglect, left hemianopia, slight nasolabial fold flattening, NIH 5. Later there seemed to be intermittent drift in left arm/leg. NIH 5-7.  Apparently he lives with a family that helps care for them, but they are not related to him, he has no immediate family. We had considerable delay (~44mn)  in treatment due to trying to figure out who to speak with to gain an understanding of time lst normal, his baseline function and consent. Despite his poor memory, he is normally very functional at baseline, dresses himself and catches a bus to his care center daily. The family in which he lives with says he started acting oddly last night, but no left side weakness reported.  Unknown time last "normal" thus no IV tPA given. We preceded to CTP and stat MRI to better evaluate for early/late ischemic changes. There was a LVO seen on CTA at the Rt M2-3 segment, however there was a short segment in M1 that also appeared to have clot burden, this was discussed with Dr DEstanislado Pandydirectly since with an infinite mismatch seen on perfusion ratio, emergent  IA Thrombectomy was indicated.   LSN: unknown tPA Given: not given  HOSPITAL COURSE Mr. KGunther Zawadzkiis a 83y.o. male with history of dementia, DM, HTN, HLD admitted for confusion, left sided weakness, left neglect, left hemianopia, left facial droop. No tPA given due to unknown LKW.    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 High grade stenosis s/p brilinta and ASA load after diagnostic angio Diffuse SAH: occurred with brilinta and ASA load after diagnostic angio    Resultant clinically brain death  Apnea test inconclusive  MRI  Right MCA patchy infarct  CTA head and neck - right distal M1 occlusion vs. High grade stenosis, left VA occlusion, BA tandem stenosis, right fetal  PCA  2D Echo  EF 55-60%  LDL 98  HgbA1c pending  SCDs for VTE prophylaxis  Diet NPO time specified  Fall precautions   unknown antithrombotics prior to admission, now on No antithrombotic due to IBrule Clinically brain dead -> family withdraw care  Clinically brain death  Pupils dilated and fixed  No brainstem reflexes  Clearly brain herniation   Apnea test inconclusive  Family withdraw care  Diabetes  HgbA1c pending goal < 7.0  CBG monitoring  SSI  Hypertension  Stable  BP goal < 160  Hyperlipidemia  Home meds:  unknown   LDL 98, goal < 70  Other Stroke Risk Factors  Advanced age  Other Active Problems  Dementia  vietnamese speaking    DISCHARGE EXAM Pt deceased  35 minutes were spent preparing discharge.  JRosalin Hawking MD PhD Stroke Neurology [id number] PM

## 2017-06-10 NOTE — Progress Notes (Signed)
  Echocardiogram 2D Echocardiogram has been performed.  Adam Hull April 08, 2018, 10:37 AM

## 2017-06-10 NOTE — Progress Notes (Signed)
PT Cancellation Note  Patient Details Name: [name] MRN: [id number] DOB: May 15, 1934   Cancelled Treatment:    Reason Eval/Treat Not Completed: Patient not medically ready. Patient was close to meeting brain death criteria yesterday evening. Will defer PT therapies at this time.    Fabio AsaDevon J Kadince Boxley 11-17-17, 10:06 AM Charlotte Crumbevon Lona Six, PT DPT  Board Certified Neurologic Specialist [phone]

## 2017-06-10 NOTE — Progress Notes (Signed)
STROKE TEAM PROGRESS NOTE   SUBJECTIVE (INTERVAL HISTORY) His niece and niece's friend from [employer] are at the bedside.  Patient still intubated, undergoing to the echo. However, clinical examination consistent with brain death.  Apnea test yesterday inconclusive.  Family has not decided on withdrawal care yet. I had long discussion with niece at bedside, updated pt current condition, treatment plan and poor prognosis. She would like to discuss with patient family in [place] and his hosting family in [place] as well as his church members to decide further plan.  I also discussed with Dr. Wallace Cullens regarding another apnea test versus nuclear brain flow study.   OBJECTIVE Temp:  [95.3 F (35.2 C)-100.2 F (37.9 C)] 100.2 F (37.9 C) (02/23 0800) Pulse Rate:  [56-77] 63 (02/23 1100) Cardiac Rhythm: Heart block (02/23 0800) Resp:  [11-28] 16 (02/23 1100) BP: (92-179)/(59-99) 107/61 (02/23 1100) SpO2:  [93 %-99 %] 96 % (02/23 1100) FiO2 (%):  [40 %-70 %] 40 % (02/23 0806) Weight:  [142 lb 10.2 oz (64.7 kg)] 142 lb 10.2 oz (64.7 kg) (02/22 1946)  Recent Labs  Lab 17-Jun-2017 2350 05/21/2017 0401 05/24/2017 0736  GLUCAP 261* 192* 179*   Recent Labs  Lab 06/17/2017 1218 June 17, 2017 1234 05/14/2017 0424  NA 136 137 135  K 4.7 4.7 4.3  CL 100* 100* 106  CO2 26  --  18*  GLUCOSE 159* 154* 202*  BUN 13 17 16   CREATININE 1.63* 1.60* 1.67*  CALCIUM 9.5  --  8.0*   Recent Labs  Lab Jun 17, 2017 1218  AST 21  ALT 17  ALKPHOS 40  BILITOT 1.3*  PROT 7.4  ALBUMIN 3.6   Recent Labs  Lab 06-17-17 1218 2017/06/17 1234 05/25/2017 0424  WBC 7.4  --  7.5  NEUTROABS 3.9  --  6.4  HGB 16.4 17.7* 13.0  HCT 49.0 52.0 39.6  MCV 85.8  --  85.3  PLT 151  --  155   No results for input(s): CKTOTAL, CKMB, CKMBINDEX, TROPONINI in the last 168 hours. Recent Labs    17-Jun-2017 1218  LABPROT 13.7  INR 1.06   No results for input(s): COLORURINE, LABSPEC, PHURINE, GLUCOSEU, HGBUR, BILIRUBINUR, KETONESUR,  PROTEINUR, UROBILINOGEN, NITRITE, LEUKOCYTESUR in the last 72 hours.  Invalid input(s): APPERANCEUR     Component Value Date/Time   CHOL 153 06/01/2017 0424   TRIG 66 05/18/2017 0424   HDL 42 06/02/2017 0424   CHOLHDL 3.6 05/31/2017 0424   VLDL 13 06/05/2017 0424   LDLCALC 98 05/29/2017 0424   No results found for: HGBA1C No results found for: LABOPIA, COCAINSCRNUR, LABBENZ, AMPHETMU, THCU, LABBARB  Recent Labs  Lab 06/17/2017 1217  ETH <10    I have personally reviewed the radiological images below and agree with the radiology interpretations.  Ct Angio Head W Or Wo Contrast  Result Date: 06/17/2017 CLINICAL DATA:  Left-sided weakness while at physical therapy today. EXAM: CT HEAD WITHOUT CONTRAST CT ANGIOGRAPHY OF THE HEAD AND NECK TECHNIQUE: Contiguous axial images were obtained from the base of the skull through the vertex without intravenous contrast. Multidetector CT imaging of the head and neck was performed using the standard protocol during bolus administration of intravenous contrast. Multiplanar CT image reconstructions and MIPs were obtained to evaluate the vascular anatomy. Carotid stenosis measurements (when applicable) are obtained utilizing NASCET criteria, using the distal internal carotid diameter as the denominator. CONTRAST:  50mL ISOVUE-370 IOPAMIDOL (ISOVUE-370) INJECTION 76% COMPARISON:  None. FINDINGS: CT HEAD Brain: Generalized atrophy. Chronic small-vessel ischemic changes  of the cerebral hemispheric white matter. No sign of acute infarction, mass lesion, hemorrhage, hydrocephalus or extra-axial collection. Vascular: There is atherosclerotic calcification of the major vessels at the base of the brain. Skull: Normal Sinuses/Orbits: Negative CTA NECK Aortic arch: Aortic atherosclerosis.  No aneurysm or dissection. Right carotid system: Common carotid artery widely patent to the bifurcation. Carotid bifurcation is normal without soft or calcified plaque. Cervical internal  carotid artery is tortuous but widely patent. Left carotid system: Common carotid artery widely patent to the bifurcation. Carotid bifurcation is normal without soft or calcified plaque. Cervical ICA is tortuous but otherwise normal. Vertebral arteries:Right vertebral artery is dominant. The origin is widely patent. The vessel is widely patent through the cervical region. Left vertebral artery is occluded at its origin but shows some reconstitution by cervical collaterals. Skeleton: Ordinary mid cervical spondylosis. Other neck: No mass or lymphadenopathy. Upper lobes are clear. There is either stenosis or partial thrombosis the left subclavian/innominate vein. CTA HEAD Anterior circulation: Both internal carotid arteries are patent through the skull base and siphon regions. There is ordinary peripheral atherosclerotic calcification in the carotid siphon regions. No stenosis greater than 30% is suspected. The anterior and middle cerebral vessels are approximately patent without proximal stenosis, aneurysm or vascular malformation. There are missing/hypoperfusion in M2 and M3 regions on the right suggesting embolic disease in the right MCA territory. Posterior circulation: Right vertebral artery is dominant, supplying the basilar. Left vertebral artery is a small vessel that terminates in PICA. Basilar artery shows multiple serial stenoses. Flow is present within the superior cerebellar and left posterior cerebral artery from the posterior circulation. Right PCA receives it supply from the anterior circulation. Venous sinuses: Patent and normal. Anatomic variants: None significant. Delayed phase: No abnormal enhancement. IMPRESSION: Head CT does not show any acute ischemic change at this moment. However, there appears to be embolic disease to the right MCA territory in the M2 to M3 vessels. Consider CT perfusion study. Left vertebral artery occluded proximally with reconstitution of a small vessel in the upper  cervical region. Serial basilar artery stenoses. These results were called by telephone at the time of interpretation on 05/26/2017 at 2:27 pm to Dr. Meridee Score , who verbally acknowledged these results. Electronically Signed   By: Paulina Fusi M.D.   On: 05/28/2017 14:30   Ct Head Wo Contrast  Result Date: 05/28/2017 CLINICAL DATA:  83 y/o  M; post IR exam. EXAM: CT HEAD WITHOUT CONTRAST TECHNIQUE: Contiguous axial images were obtained from the base of the skull through the vertex without intravenous contrast. COMPARISON:  05/15/2017 MRI head. 05/17/2017 CT head and CTA head. 06/09/2017 cerebral angiogram. FINDINGS: Brain: Decreased high attenuation material throughout subarachnoid and intraventricular spaces concentrated basilar and posterior fossa cisterns. Extra-axial material in right MCA station measures 73 HU, likely hemorrhage. Elsewhere extra-axial material measures greater than 100 HU, likely contrast and possibly underlying hemorrhage. Interval dilatation of the lateral and third ventricles compatible developing hydrocephalus, third ventricle measures 11 mm medial-lateral, previously 9 mm. Small foci of hypoattenuation in right temporal lobe corresponding to infarcts on MR. No large brain parenchymal hemorrhage identified. Vascular: Obscured by extra-axial material. Skull: Normal. Negative for fracture or focal lesion. Sinuses/Orbits: Mild diffuse paranasal sinus mucosal thickening. Partial opacification of left mastoid air cells. Orbits are unremarkable. Other: None. IMPRESSION: 1. High attenuation material throughout subarachnoid and intraventricular spaces. In right MCA cistern there is likely hemorrhage. Elsewhere material may represent contrast and/or hemorrhage. 2. Interval development of mild hydrocephalus.  No herniation. 3. Small foci of infarction in right temporal lobe as seen on MRI. No brain parenchymal hemorrhage identified. These results were called by telephone at the time of  interpretation on 06/06/17 at 8:07 pm to Dr. Amada Jupiter, who verbally acknowledged these results. Electronically Signed   By: Mitzi Hansen M.D.   On: Jun 06, 2017 20:08   Ct Head Wo Contrast  Result Date: Jun 06, 2017 CLINICAL DATA:  Left-sided weakness while at physical therapy today. EXAM: CT HEAD WITHOUT CONTRAST CT ANGIOGRAPHY OF THE HEAD AND NECK TECHNIQUE: Contiguous axial images were obtained from the base of the skull through the vertex without intravenous contrast. Multidetector CT imaging of the head and neck was performed using the standard protocol during bolus administration of intravenous contrast. Multiplanar CT image reconstructions and MIPs were obtained to evaluate the vascular anatomy. Carotid stenosis measurements (when applicable) are obtained utilizing NASCET criteria, using the distal internal carotid diameter as the denominator. CONTRAST:  50mL ISOVUE-370 IOPAMIDOL (ISOVUE-370) INJECTION 76% COMPARISON:  None. FINDINGS: CT HEAD Brain: Generalized atrophy. Chronic small-vessel ischemic changes of the cerebral hemispheric white matter. No sign of acute infarction, mass lesion, hemorrhage, hydrocephalus or extra-axial collection. Vascular: There is atherosclerotic calcification of the major vessels at the base of the brain. Skull: Normal Sinuses/Orbits: Negative CTA NECK Aortic arch: Aortic atherosclerosis.  No aneurysm or dissection. Right carotid system: Common carotid artery widely patent to the bifurcation. Carotid bifurcation is normal without soft or calcified plaque. Cervical internal carotid artery is tortuous but widely patent. Left carotid system: Common carotid artery widely patent to the bifurcation. Carotid bifurcation is normal without soft or calcified plaque. Cervical ICA is tortuous but otherwise normal. Vertebral arteries:Right vertebral artery is dominant. The origin is widely patent. The vessel is widely patent through the cervical region. Left vertebral artery  is occluded at its origin but shows some reconstitution by cervical collaterals. Skeleton: Ordinary mid cervical spondylosis. Other neck: No mass or lymphadenopathy. Upper lobes are clear. There is either stenosis or partial thrombosis the left subclavian/innominate vein. CTA HEAD Anterior circulation: Both internal carotid arteries are patent through the skull base and siphon regions. There is ordinary peripheral atherosclerotic calcification in the carotid siphon regions. No stenosis greater than 30% is suspected. The anterior and middle cerebral vessels are approximately patent without proximal stenosis, aneurysm or vascular malformation. There are missing/hypoperfusion in M2 and M3 regions on the right suggesting embolic disease in the right MCA territory. Posterior circulation: Right vertebral artery is dominant, supplying the basilar. Left vertebral artery is a small vessel that terminates in PICA. Basilar artery shows multiple serial stenoses. Flow is present within the superior cerebellar and left posterior cerebral artery from the posterior circulation. Right PCA receives it supply from the anterior circulation. Venous sinuses: Patent and normal. Anatomic variants: None significant. Delayed phase: No abnormal enhancement. IMPRESSION: Head CT does not show any acute ischemic change at this moment. However, there appears to be embolic disease to the right MCA territory in the M2 to M3 vessels. Consider CT perfusion study. Left vertebral artery occluded proximally with reconstitution of a small vessel in the upper cervical region. Serial basilar artery stenoses. These results were called by telephone at the time of interpretation on 2017/06/06 at 2:27 pm to Dr. Meridee Score , who verbally acknowledged these results. Electronically Signed   By: Paulina Fusi M.D.   On: 06-06-2017 14:30   Ct Angio Neck W Or Wo Contrast  Result Date: 2017/06/06 CLINICAL DATA:  Left-sided weakness while at physical  therapy  today. EXAM: CT HEAD WITHOUT CONTRAST CT ANGIOGRAPHY OF THE HEAD AND NECK TECHNIQUE: Contiguous axial images were obtained from the base of the skull through the vertex without intravenous contrast. Multidetector CT imaging of the head and neck was performed using the standard protocol during bolus administration of intravenous contrast. Multiplanar CT image reconstructions and MIPs were obtained to evaluate the vascular anatomy. Carotid stenosis measurements (when applicable) are obtained utilizing NASCET criteria, using the distal internal carotid diameter as the denominator. CONTRAST:  50mL ISOVUE-370 IOPAMIDOL (ISOVUE-370) INJECTION 76% COMPARISON:  None. FINDINGS: CT HEAD Brain: Generalized atrophy. Chronic small-vessel ischemic changes of the cerebral hemispheric white matter. No sign of acute infarction, mass lesion, hemorrhage, hydrocephalus or extra-axial collection. Vascular: There is atherosclerotic calcification of the major vessels at the base of the brain. Skull: Normal Sinuses/Orbits: Negative CTA NECK Aortic arch: Aortic atherosclerosis.  No aneurysm or dissection. Right carotid system: Common carotid artery widely patent to the bifurcation. Carotid bifurcation is normal without soft or calcified plaque. Cervical internal carotid artery is tortuous but widely patent. Left carotid system: Common carotid artery widely patent to the bifurcation. Carotid bifurcation is normal without soft or calcified plaque. Cervical ICA is tortuous but otherwise normal. Vertebral arteries:Right vertebral artery is dominant. The origin is widely patent. The vessel is widely patent through the cervical region. Left vertebral artery is occluded at its origin but shows some reconstitution by cervical collaterals. Skeleton: Ordinary mid cervical spondylosis. Other neck: No mass or lymphadenopathy. Upper lobes are clear. There is either stenosis or partial thrombosis the left subclavian/innominate vein. CTA HEAD Anterior  circulation: Both internal carotid arteries are patent through the skull base and siphon regions. There is ordinary peripheral atherosclerotic calcification in the carotid siphon regions. No stenosis greater than 30% is suspected. The anterior and middle cerebral vessels are approximately patent without proximal stenosis, aneurysm or vascular malformation. There are missing/hypoperfusion in M2 and M3 regions on the right suggesting embolic disease in the right MCA territory. Posterior circulation: Right vertebral artery is dominant, supplying the basilar. Left vertebral artery is a small vessel that terminates in PICA. Basilar artery shows multiple serial stenoses. Flow is present within the superior cerebellar and left posterior cerebral artery from the posterior circulation. Right PCA receives it supply from the anterior circulation. Venous sinuses: Patent and normal. Anatomic variants: None significant. Delayed phase: No abnormal enhancement. IMPRESSION: Head CT does not show any acute ischemic change at this moment. However, there appears to be embolic disease to the right MCA territory in the M2 to M3 vessels. Consider CT perfusion study. Left vertebral artery occluded proximally with reconstitution of a small vessel in the upper cervical region. Serial basilar artery stenoses. These results were called by telephone at the time of interpretation on 06-09-17 at 2:27 pm to Dr. Meridee Score , who verbally acknowledged these results. Electronically Signed   By: Paulina Fusi M.D.   On: 2017/06/09 14:30   Mr Brain Wo Contrast  Result Date: Jun 09, 2017 CLINICAL DATA:  Code stroke follow-up. The patient was sent directly to interventional radiology following examination. EXAM: MRI HEAD WITHOUT CONTRAST TECHNIQUE: Multiplanar, multiecho pulse sequences of the brain and surrounding structures were obtained without intravenous contrast. COMPARISON:  Head CT and CTA/perfusion 06-09-17 FINDINGS: Axial diffusion  weighted imaging and axial T2-weighted FLAIR imaging were performed. There is multifocal abnormal diffusion restriction within the posterior right MCA territory, much less than the volume of penumbra identified on the perfusion study. Multifocal periventricular white matter hyperintensity, most  often a result of chronic microvascular ischemia. No acute hemorrhage or mass effect. IMPRESSION: Multiple areas of ischemic infarct within the posterior right MCA territory, but much smaller than the volume of penumbra identified on the perfusion study. Electronically Signed   By: Deatra Robinson M.D.   On: 2017-06-11 16:08   Ct Cerebral Perfusion W Contrast  Result Date: 06-11-2017 CLINICAL DATA:  83 year old male with right MCA M2/M3 thromboembolic disease suspected on CTA head and neck today performed for stroke-like symptoms. CT perfusion requested to evaluate treatment options. EXAM: CT PERFUSION BRAIN TECHNIQUE: Multiphase CT imaging of the brain was performed following IV bolus contrast injection. Subsequent parametric perfusion maps were calculated using RAPID software. CONTRAST:  40mL ISOVUE-370 IOPAMIDOL (ISOVUE-370) INJECTION 76% COMPARISON:  CTA head and neck 1358 hr today. FINDINGS: CT Brain Perfusion Findings: CBF (<30%) Volume: None. Perfusion (Tmax>6.0s) volume: 106  mL Mismatch Volume: 106 mL Mismatch ratio: Infant. Infarction Location:No core infarct, but right MCA territory penumbra. Other findings: CT perfusion source images demonstrate the same pattern of abnormal right MCA branches as on the earlier CTA. There seems to be a short right M1 segment, early bifurcation. It is unclear whether or not there is focal thrombus at the right MCA bifurcation. IMPRESSION: 1. No core infarct detected by CT perfusion, which seems valid in light of normal ASPECTS thus far. 2. Right MCA territory penumbra estimated at 106 mL. 3. This study was reviewed in person with Dr. Milon Dikes on 06-11-2017 at 1540 hrs.  Electronically Signed   By: Odessa Fleming M.D.   On: June 11, 2017 15:56   Dg Chest Port 1 View  Result Date: 06/11/2017 CLINICAL DATA:  Ventilator dependent EXAM: PORTABLE CHEST 1 VIEW COMPARISON:  None. FINDINGS: Endotracheal tube tip is about 3.8 cm superior to the carina. Esophageal tube tip is below the diaphragm but is non included. Minimal atelectasis at the bases. No pleural effusion. Normal heart size. Prominent aortic knob measuring up to 4.1 cm. No pneumothorax. IMPRESSION: 1.  Et tube tip 3.8 cm above carina. 2.  Mild bibasilar atelectasis. Electronically Signed   By: Jasmine Pang M.D.   On: June 11, 2017 21:09    TTE - Left ventricle: The cavity size was normal. Systolic function was   normal. The estimated ejection fraction was in the range of 55%   to 60%. Wall motion was normal; there were no regional wall   motion abnormalities. Doppler parameters are consistent with   abnormal left ventricular relaxation (grade 1 diastolic   dysfunction). Doppler parameters are consistent with high   ventricular filling pressure. Mild focal basal septal   hypertrophy. - Aortic valve: Mildly calcified annulus. Trileaflet. There was   mild regurgitation. - Mitral valve: Mildly calcified annulus. - Left atrium: The atrium was mildly dilated. - Atrial septum: No defect or patent foramen ovale was identified.   PHYSICAL EXAM  Temp:  [95.3 F (35.2 C)-100.2 F (37.9 C)] 100.2 F (37.9 C) (02/23 0800) Pulse Rate:  [56-77] 63 (02/23 1100) Resp:  [11-28] 16 (02/23 1100) BP: (92-179)/(59-99) 107/61 (02/23 1100) SpO2:  [93 %-99 %] 96 % (02/23 1100) FiO2 (%):  [40 %-70 %] 40 % (02/23 0806) Weight:  [142 lb 10.2 oz (64.7 kg)] 142 lb 10.2 oz (64.7 kg) (02/22 1946)  General - Well nourished, well developed, intubated.  Ophthalmologic - fundi not visualized due to ET positioning.  Cardiovascular - Regular rate and rhythm.  Neuro - intubated, not on sedation, unresponsive.  Eyes not open, with forced  eyelid opening, bilateral pupil dilated and fixed, no pupillary reflex, no corneal or gag or cough reflexes.  No doll's eyes.  Cold caloric test not performed.  Not breathing over the vent.  On pain summation no responses in all limbs.  DTR diminished, no Babinski. Sensation, coordination and gait not tested.   ASSESSMENT/PLAN Mr. Kathee PoliteKad Huckeba is a 83 y.o. male with history of dementia, DM, HTN, HLD admitted for confusion, left sided weakness, left neglect, left hemianopia, left facial droop. No tPA given due to unknown LKW.    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 High grade stenosis s/p brilinta and ASA load after diagnostic angio Diffuse SAH: occurred with brilinta and ASA load after diagnostic angio    Resultant clinically brain death  Apnea test inconclusive  MRI  Right MCA patchy infarct  CTA head and neck - right distal M1 occlusion vs. High grade stenosis, left VA occlusion, BA tandem stenosis, right fetal PCA  2D Echo  EF 55-60%  LDL 98  HgbA1c pending  SCDs for VTE prophylaxis  Diet NPO time specified  Fall precautions   unknown antithrombotics prior to admission, now on No antithrombotic due to ICH  Clinically brain dead  I had long discussion with niece and another male friend at bedside, updated pt current condition, treatment plan and diagnosis of clinical brain death. They like to discuss with other family members regarding further brain death testing vs. Withdraw care.  Clinically brain death  Pupils dilated and fixed  No brainstem reflexes  Clearly brain herniation   Apnea test inconclusive  Family in discussion of withdraw care or further brain death testing  Diabetes  HgbA1c pending goal < 7.0  CBG monitoring  SSI  Hypertension Stable BP goal < 160  Hyperlipidemia  Home meds:  unknown   LDL 98, goal < 70  Other Stroke Risk Factors  Advanced age  Other Active Problems  Dementia  vietnamese    Hospital day # 1  This patient is critically ill due to clinical brain death and at significant risk of cardiac arrest and cardiovascular death. This patient's care requires constant monitoring of vital signs, hemodynamics, respiratory and cardiac monitoring, review of multiple databases, neurological assessment, discussion with family, other specialists and medical decision making of high complexity. I spent 50 minutes of neurocritical care time in the care of this patient.   Marvel PlanJindong Nickolai Rinks, MD PhD Stroke Neurology 02/06/18 11:40 AM    To contact Stroke Continuity provider, please refer to [email or web address]. After hours, contact General Neurology

## 2017-06-10 NOTE — Progress Notes (Signed)
Per Kathlene NovemberMike from [facility], pt is medically ruled out. Please call back with time of death. Dr. Wallace CullensGray aware.

## 2017-06-10 DEATH — deceased

## 2017-06-16 ENCOUNTER — Encounter (HOSPITAL_COMMUNITY): Payer: Self-pay | Admitting: Interventional Radiology

## 2017-06-17 ENCOUNTER — Encounter (HOSPITAL_COMMUNITY): Payer: Self-pay | Admitting: Interventional Radiology

## 2017-10-20 NOTE — Telephone Encounter (Signed)
Signing past encounter of attempted phone call 

## 2018-08-27 IMAGING — DX DG CHEST 1V PORT
1 series · 1 of 1 positions shown · non-contrast
Comparison: None.

CLINICAL DATA: Ventilator dependent

EXAM:
PORTABLE CHEST 1 VIEW

[chest ap]
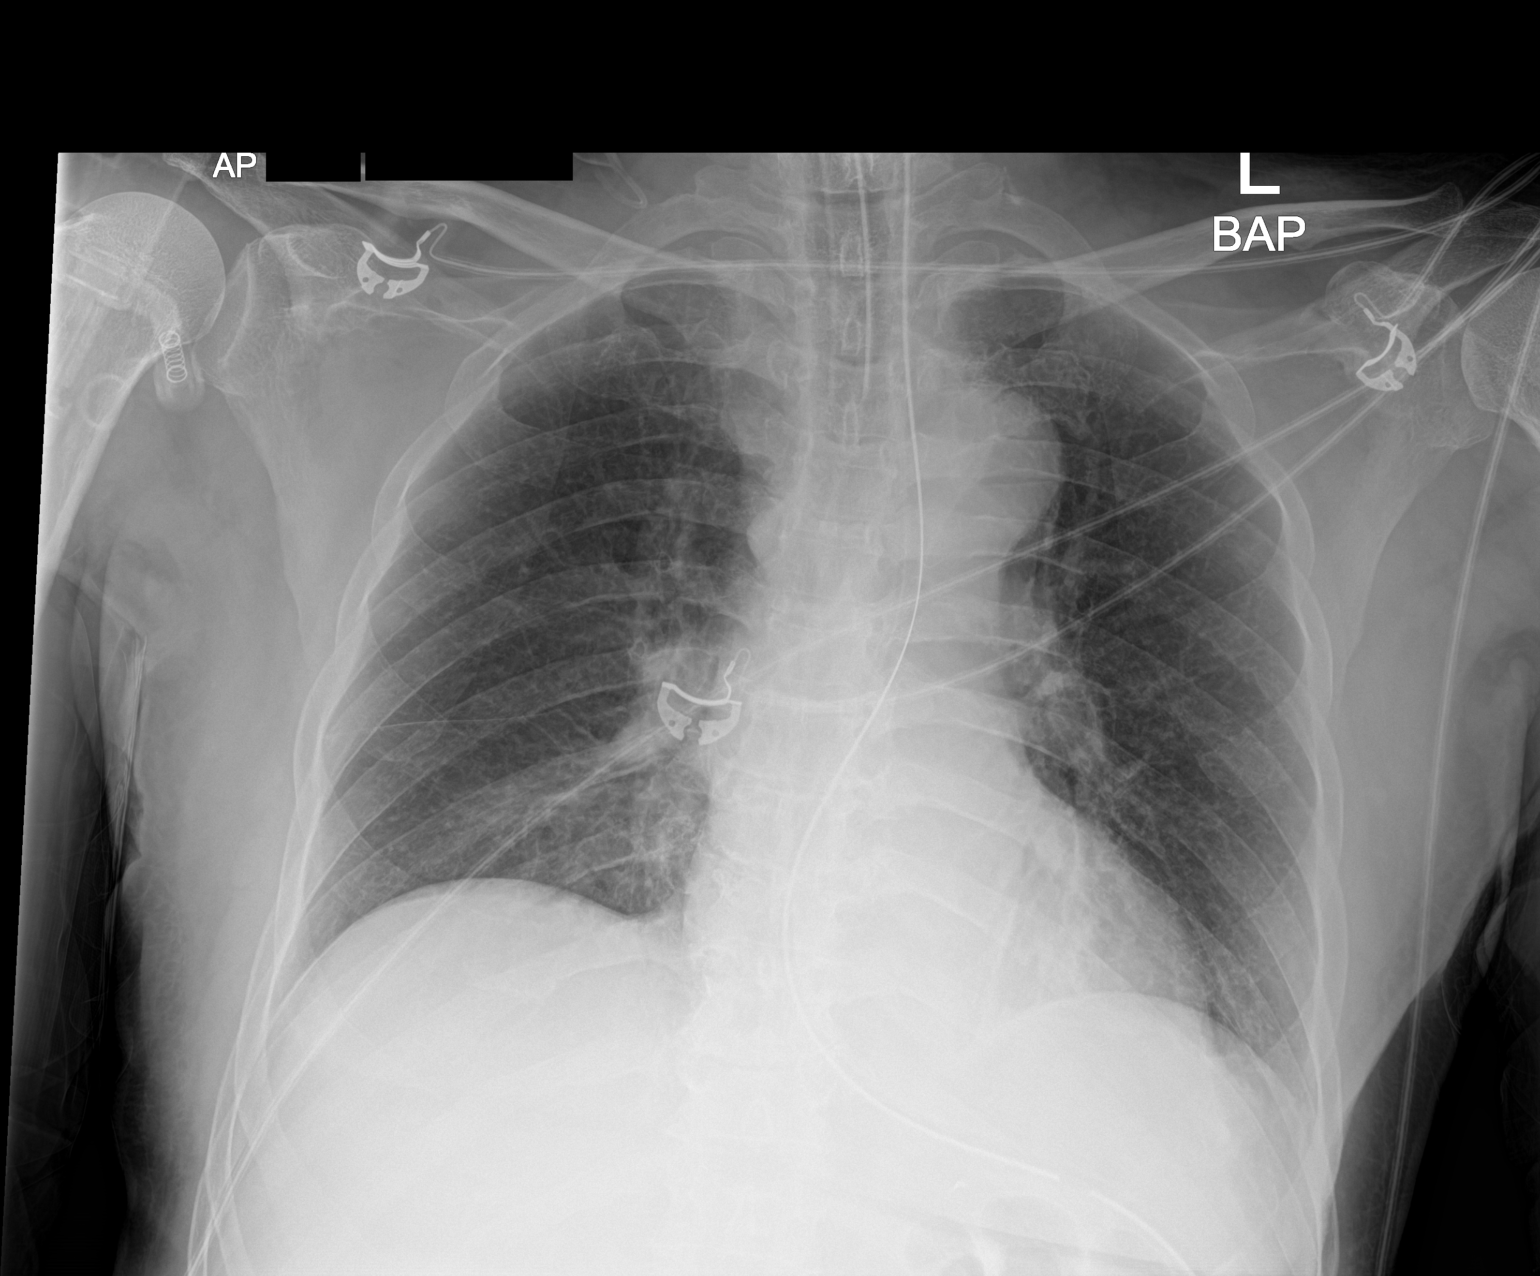

[1 of 1 positions shown; findings below may reference images not displayed]

FINDINGS: Endotracheal tube tip is about 3.8 cm superior to the carina.
Esophageal tube tip is below the diaphragm but is non included.
Minimal atelectasis at the bases. No pleural effusion. Normal heart
size. Prominent aortic knob measuring up to 4.1 cm. No pneumothorax.
IMPRESSION: 1.  Et tube tip 3.8 cm above carina.

2.  Mild bibasilar atelectasis.
